# Patient Record
Sex: Female | Born: 1955 | Race: White | Hispanic: No | Smoking: Never smoker
Health system: Southern US, Community
[De-identification: ages and names within clinical notes are randomized; demographics above are authoritative.]

## PROBLEM LIST (undated history)

## (undated) DIAGNOSIS — E785 Hyperlipidemia, unspecified: Secondary | ICD-10-CM

## (undated) DIAGNOSIS — H21233 Degeneration of iris (pigmentary), bilateral: Secondary | ICD-10-CM

## (undated) DIAGNOSIS — H269 Unspecified cataract: Secondary | ICD-10-CM

## (undated) DIAGNOSIS — E079 Disorder of thyroid, unspecified: Secondary | ICD-10-CM

## (undated) DIAGNOSIS — H532 Diplopia: Principal | ICD-10-CM

## (undated) DIAGNOSIS — I10 Essential (primary) hypertension: Secondary | ICD-10-CM

## (undated) HISTORY — PX: TONSILLECTOMY: SUR1361

## (undated) HISTORY — DX: Hyperlipidemia, unspecified: E78.5

## (undated) HISTORY — DX: Essential (primary) hypertension: I10

## (undated) HISTORY — DX: Degeneration of iris (pigmentary), bilateral: H21.233

## (undated) HISTORY — DX: Disorder of thyroid, unspecified: E07.9

## (undated) HISTORY — PX: CATARACT EXTRACTION: SUR2

## (undated) HISTORY — DX: Diplopia: H53.2

## (undated) HISTORY — PX: WISDOM TOOTH EXTRACTION: SHX21

## (undated) HISTORY — DX: Unspecified cataract: H26.9

---

## 2003-05-18 ENCOUNTER — Other Ambulatory Visit: Admission: RE | Admit: 2003-05-18 | Discharge: 2003-05-18 | Payer: Self-pay | Admitting: Obstetrics and Gynecology

## 2004-03-16 ENCOUNTER — Ambulatory Visit: Payer: Self-pay | Admitting: Family Medicine

## 2004-07-30 ENCOUNTER — Ambulatory Visit: Payer: Self-pay | Admitting: Family Medicine

## 2004-12-13 ENCOUNTER — Encounter: Payer: Self-pay | Admitting: Family Medicine

## 2004-12-13 ENCOUNTER — Other Ambulatory Visit: Admission: RE | Admit: 2004-12-13 | Discharge: 2004-12-13 | Payer: Self-pay | Admitting: Family Medicine

## 2004-12-13 ENCOUNTER — Ambulatory Visit: Payer: Self-pay | Admitting: Family Medicine

## 2005-11-29 ENCOUNTER — Ambulatory Visit: Payer: Self-pay | Admitting: Family Medicine

## 2005-12-12 ENCOUNTER — Ambulatory Visit: Payer: Self-pay

## 2005-12-13 ENCOUNTER — Ambulatory Visit: Payer: Self-pay | Admitting: Family Medicine

## 2005-12-13 LAB — CONVERTED CEMR LAB
Alkaline Phosphatase: 41 units/L (ref 39–117)
BUN: 17 mg/dL (ref 6–23)
CO2: 30 meq/L (ref 19–32)
Calcium: 9.5 mg/dL (ref 8.4–10.5)
GFR calc non Af Amer: 81 mL/min
Potassium: 4.1 meq/L (ref 3.5–5.1)
Total Protein: 7.4 g/dL (ref 6.0–8.3)

## 2005-12-24 ENCOUNTER — Ambulatory Visit: Payer: Self-pay | Admitting: Family Medicine

## 2005-12-24 LAB — CONVERTED CEMR LAB
AST: 40 units/L — ABNORMAL HIGH (ref 0–37)
Albumin: 4.3 g/dL (ref 3.5–5.2)

## 2006-01-17 ENCOUNTER — Ambulatory Visit: Payer: Self-pay | Admitting: Family Medicine

## 2006-01-17 LAB — CONVERTED CEMR LAB
ALT: 28 U/L
AST: 24 U/L
Albumin: 3.6 g/dL
Alkaline Phosphatase: 44 U/L
Bilirubin, Direct: 0.1 mg/dL
Total Bilirubin: 0.6 mg/dL
Total Protein: 6.9 g/dL

## 2006-05-07 ENCOUNTER — Encounter: Payer: Self-pay | Admitting: Family Medicine

## 2006-05-07 ENCOUNTER — Ambulatory Visit: Payer: Self-pay | Admitting: Family Medicine

## 2006-05-07 ENCOUNTER — Other Ambulatory Visit: Admission: RE | Admit: 2006-05-07 | Discharge: 2006-05-07 | Payer: Self-pay | Admitting: Family Medicine

## 2006-05-07 DIAGNOSIS — E785 Hyperlipidemia, unspecified: Secondary | ICD-10-CM | POA: Insufficient documentation

## 2006-05-07 DIAGNOSIS — Z9189 Other specified personal risk factors, not elsewhere classified: Secondary | ICD-10-CM | POA: Insufficient documentation

## 2006-05-07 DIAGNOSIS — I10 Essential (primary) hypertension: Secondary | ICD-10-CM | POA: Insufficient documentation

## 2006-05-07 LAB — CONVERTED CEMR LAB
ALT: 20 units/L (ref 0–40)
Basophils Relative: 0.5 % (ref 0.0–1.0)
Bilirubin, Direct: 0.1 mg/dL (ref 0.0–0.3)
CO2: 29 meq/L (ref 19–32)
Calcium: 9.3 mg/dL (ref 8.4–10.5)
Creatinine, Ser: 0.8 mg/dL (ref 0.4–1.2)
Eosinophils Relative: 1.6 % (ref 0.0–5.0)
GFR calc Af Amer: 98 mL/min
Glucose, Bld: 93 mg/dL (ref 70–99)
Hemoglobin: 14.1 g/dL (ref 12.0–15.0)
Lymphocytes Relative: 31.3 % (ref 12.0–46.0)
Monocytes Absolute: 0.4 10*3/uL (ref 0.2–0.7)
Neutro Abs: 2.5 10*3/uL (ref 1.4–7.7)
Nitrite: NEGATIVE
Platelets: 187 10*3/uL (ref 150–400)
Protein, U semiquant: NEGATIVE
RDW: 11.8 % (ref 11.5–14.6)
Specific Gravity, Urine: 1.015
Total Bilirubin: 0.8 mg/dL (ref 0.3–1.2)
Total Protein: 7 g/dL (ref 6.0–8.3)
VLDL: 33 mg/dL (ref 0–40)
WBC: 4.4 10*3/uL — ABNORMAL LOW (ref 4.5–10.5)
pH: 6

## 2006-07-24 ENCOUNTER — Encounter: Payer: Self-pay | Admitting: Family Medicine

## 2007-07-03 ENCOUNTER — Telehealth (INDEPENDENT_AMBULATORY_CARE_PROVIDER_SITE_OTHER): Payer: Self-pay | Admitting: *Deleted

## 2007-07-17 ENCOUNTER — Encounter (INDEPENDENT_AMBULATORY_CARE_PROVIDER_SITE_OTHER): Payer: Self-pay | Admitting: *Deleted

## 2007-07-17 ENCOUNTER — Ambulatory Visit: Payer: Self-pay | Admitting: Family Medicine

## 2007-07-20 ENCOUNTER — Encounter (INDEPENDENT_AMBULATORY_CARE_PROVIDER_SITE_OTHER): Payer: Self-pay | Admitting: *Deleted

## 2007-08-03 ENCOUNTER — Ambulatory Visit: Payer: Self-pay | Admitting: Gastroenterology

## 2008-02-29 ENCOUNTER — Ambulatory Visit: Payer: Self-pay | Admitting: Family Medicine

## 2008-02-29 DIAGNOSIS — B029 Zoster without complications: Secondary | ICD-10-CM | POA: Insufficient documentation

## 2008-06-24 ENCOUNTER — Encounter: Admission: RE | Admit: 2008-06-24 | Discharge: 2008-06-24 | Payer: Self-pay | Admitting: Family Medicine

## 2008-07-19 ENCOUNTER — Ambulatory Visit: Payer: Self-pay | Admitting: Family Medicine

## 2008-07-19 ENCOUNTER — Other Ambulatory Visit: Admission: RE | Admit: 2008-07-19 | Discharge: 2008-07-19 | Payer: Self-pay | Admitting: Family Medicine

## 2008-07-19 ENCOUNTER — Encounter: Payer: Self-pay | Admitting: Family Medicine

## 2008-07-25 ENCOUNTER — Encounter (INDEPENDENT_AMBULATORY_CARE_PROVIDER_SITE_OTHER): Payer: Self-pay | Admitting: *Deleted

## 2008-07-27 ENCOUNTER — Telehealth (INDEPENDENT_AMBULATORY_CARE_PROVIDER_SITE_OTHER): Payer: Self-pay | Admitting: *Deleted

## 2008-08-01 ENCOUNTER — Encounter (INDEPENDENT_AMBULATORY_CARE_PROVIDER_SITE_OTHER): Payer: Self-pay | Admitting: *Deleted

## 2008-10-19 ENCOUNTER — Encounter: Payer: Self-pay | Admitting: Family Medicine

## 2009-07-12 ENCOUNTER — Encounter: Admission: RE | Admit: 2009-07-12 | Discharge: 2009-07-12 | Payer: Self-pay | Admitting: Family Medicine

## 2009-11-07 ENCOUNTER — Encounter: Payer: Self-pay | Admitting: Family Medicine

## 2009-11-07 ENCOUNTER — Other Ambulatory Visit: Admission: RE | Admit: 2009-11-07 | Discharge: 2009-11-07 | Payer: Self-pay | Admitting: Family Medicine

## 2009-11-07 ENCOUNTER — Ambulatory Visit: Payer: Self-pay | Admitting: Family Medicine

## 2009-11-08 ENCOUNTER — Encounter: Payer: Self-pay | Admitting: Gastroenterology

## 2009-11-09 ENCOUNTER — Ambulatory Visit: Payer: Self-pay | Admitting: Family Medicine

## 2009-11-09 LAB — CONVERTED CEMR LAB
Blood in Urine, dipstick: NEGATIVE
Glucose, Urine, Semiquant: NEGATIVE
Nitrite: NEGATIVE
Protein, U semiquant: NEGATIVE
Urobilinogen, UA: 0.2
WBC Urine, dipstick: NEGATIVE
pH: 5

## 2009-11-14 LAB — CONVERTED CEMR LAB
ALT: 17 units/L (ref 0–35)
Albumin: 4 g/dL (ref 3.5–5.2)
Alkaline Phosphatase: 54 units/L (ref 39–117)
Basophils Absolute: 0 10*3/uL (ref 0.0–0.1)
Bilirubin, Direct: 0.1 mg/dL (ref 0.0–0.3)
CO2: 26 meq/L (ref 19–32)
Chloride: 102 meq/L (ref 96–112)
Creatinine, Ser: 0.7 mg/dL (ref 0.4–1.2)
Eosinophils Absolute: 0.1 10*3/uL (ref 0.0–0.7)
Glucose, Bld: 81 mg/dL (ref 70–99)
HDL: 54.8 mg/dL (ref >39.00)
Lymphocytes Relative: 37.5 % (ref 12.0–46.0)
MCHC: 35.3 g/dL (ref 30.0–36.0)
Neutrophils Relative %: 50.7 % (ref 43.0–77.0)
RBC: 4.53 M/uL (ref 3.87–5.11)
RDW: 12.9 % (ref 11.5–14.6)
Total Protein: 6.7 g/dL (ref 6.0–8.3)
Triglycerides: 174 mg/dL — ABNORMAL HIGH (ref 0.0–149.0)

## 2009-12-04 ENCOUNTER — Encounter (INDEPENDENT_AMBULATORY_CARE_PROVIDER_SITE_OTHER): Payer: Self-pay | Admitting: *Deleted

## 2009-12-06 ENCOUNTER — Ambulatory Visit: Payer: Self-pay | Admitting: Gastroenterology

## 2009-12-20 ENCOUNTER — Ambulatory Visit: Payer: Self-pay | Admitting: Gastroenterology

## 2009-12-22 ENCOUNTER — Encounter: Payer: Self-pay | Admitting: Gastroenterology

## 2010-02-24 ENCOUNTER — Encounter: Payer: Self-pay | Admitting: Family Medicine

## 2010-03-04 LAB — CONVERTED CEMR LAB
ALT: 17 units/L (ref 0–35)
ALT: 21 units/L (ref 0–35)
AST: 24 units/L (ref 0–37)
AST: 25 units/L (ref 0–37)
Alkaline Phosphatase: 52 units/L (ref 39–117)
Basophils Relative: 0 % (ref 0.0–3.0)
Basophils Relative: 0.5 % (ref 0.0–1.0)
Bilirubin Urine: NEGATIVE
Bilirubin, Direct: 0 mg/dL (ref 0.0–0.3)
CO2: 30 meq/L (ref 19–32)
CRP, High Sensitivity: <1 — ABNORMAL LOW (ref 0.00–5.00)
Calcium: 9.8 mg/dL (ref 8.4–10.5)
Calcium: 9.9 mg/dL (ref 8.4–10.5)
Chloride: 104 meq/L (ref 96–112)
Cholesterol: 270 mg/dL — ABNORMAL HIGH (ref 0–200)
Creatinine, Ser: 0.7 mg/dL (ref 0.4–1.2)
Creatinine, Ser: 0.8 mg/dL (ref 0.4–1.2)
Direct LDL: 183.9 mg/dL
Eosinophils Relative: 2 % (ref 0.0–5.0)
Eosinophils Relative: 2.1 % (ref 0.0–5.0)
Glucose, Bld: 90 mg/dL (ref 70–99)
HDL: 51.9 mg/dL (ref >39.0)
Hemoglobin: 15.1 g/dL — ABNORMAL HIGH (ref 12.0–15.0)
Hemoglobin: 15.8 g/dL — ABNORMAL HIGH (ref 12.0–15.0)
Ketones, urine, test strip: NEGATIVE
Lymphocytes Relative: 30.7 % (ref 12.0–46.0)
Lymphocytes Relative: 34.9 % (ref 12.0–46.0)
MCHC: 34.4 g/dL (ref 30.0–36.0)
Monocytes Relative: 8.2 % (ref 3.0–12.0)
Monocytes Relative: 9.1 % (ref 3.0–12.0)
Neutro Abs: 2.3 10*3/uL (ref 1.4–7.7)
Neutro Abs: 2.5 10*3/uL (ref 1.4–7.7)
Neutrophils Relative %: 54.8 % (ref 43.0–77.0)
Neutrophils Relative %: 57.7 % (ref 43.0–77.0)
Nitrite: NEGATIVE
Pap Smear: NORMAL
Protein, U semiquant: NEGATIVE
RBC: 4.58 M/uL (ref 3.87–5.11)
RBC: 4.77 M/uL (ref 3.87–5.11)
Sodium: 141 meq/L (ref 135–145)
Total Bilirubin: 0.9 mg/dL (ref 0.3–1.2)
Total CHOL/HDL Ratio: 5
Total CHOL/HDL Ratio: 5
Total Protein: 7.5 g/dL (ref 6.0–8.3)
Total Protein: 7.5 g/dL (ref 6.0–8.3)
Triglycerides: 132 mg/dL (ref 0.0–149.0)
Urobilinogen, UA: NEGATIVE
VLDL: 22 mg/dL (ref 0–40)
VLDL: 26.4 mg/dL (ref 0.0–40.0)
WBC: 4.2 10*3/uL — ABNORMAL LOW (ref 4.5–10.5)
WBC: 4.3 10*3/uL — ABNORMAL LOW (ref 4.5–10.5)

## 2010-03-06 NOTE — Miscellaneous (Signed)
Summary: [facility]  Clinical Lists Changes  Medications: Added new medication of MOVIPREP 100 GM  SOLR (PEG-KCL-NACL-NASULF-NA ASC-C) As per prep instructions. - Signed Rx of MOVIPREP 100 GM  SOLR (PEG-KCL-NACL-NASULF-NA ASC-C) As per prep instructions.;  #1 x 0;  Signed;  Entered by: Ezra Sites RN;  Authorized by: Mardella Layman MD [facility];  Method used: Electronically to [facility] [phone]*, [address] [place], [place], [place]  [address], Ph: [phone] or [phone], Fax: [phone] Observations: Added new observation of NKA: T (12/06/2009 10:20)    Prescriptions: MOVIPREP 100 GM  SOLR (PEG-KCL-NACL-NASULF-NA ASC-C) As per prep instructions.  #1 x 0   Entered by:   Ezra Sites RN   Authorized by:   Mardella Layman MD [facility]   Signed by:   Ezra Sites RN on 12/06/2009   Method used:   Electronically to        [facility] [phone]* (retail)       [address] [place], [place]  [address]       Ph: [phone] or [phone]       Fax: [phone]   RxID:   [id number]

## 2010-03-06 NOTE — Procedures (Signed)
Summary: Colonoscopy  Patient: Sarah Zuniga Note: All result statuses are Final unless otherwise noted.  Tests: (1) Colonoscopy (COL)   COL Colonoscopy           DONE     [facility]     [address]     [place], [place]  [address]           COLONOSCOPY PROCEDURE REPORT           PATIENT:  [name], [name]  MR#:  [id number]     BIRTHDATE:  [birth year], 53 yrs. old  GENDER:  female     ENDOSCOPIST:  Vania Rea. Jarold Motto, MD, [facility]     REF. BY:  Loreen Freud, DO     PROCEDURE DATE:  12/20/2009     PROCEDURE:  Colonoscopy with snare polypectomy     ASA CLASS:  Class II     INDICATIONS:  Routine Risk Screening     MEDICATIONS:   Fentanyl 75 mcg IV, Versed 8 mg IV           DESCRIPTION OF PROCEDURE:   After the risks benefits and     alternatives of the procedure were thoroughly explained, informed     consent was obtained.  Digital rectal exam was performed and     revealed no abnormalities.   The LB CF-[id number] endoscope     was introduced through the anus and advanced to the cecum, which     was identified by both the appendix and ileocecal valve, without     limitations.  The quality of the prep was excellent, using     MoviPrep.  The instrument was then slowly withdrawn as the colon     was fully examined.     <<PROCEDUREIMAGES>>           FINDINGS:  A sessile polyp was found in the descending colon. 3-4     mm flat polyp cold snare excised and sent to patn.  This was     otherwise a normal examination of the colon.   Retroflexed views     in the rectum revealed no abnormalities.    The scope was then     withdrawn from the patient and the procedure completed.           COMPLICATIONS:  None     ENDOSCOPIC IMPRESSION:     1) Sessile polyp in the descending colon     2) Otherwise normal examination     R/O ADENOMA.     RECOMMENDATIONS:     1) Repeat colonoscopy in 5 years if polyp adenomatous; otherwise     10 years     REPEAT EXAM:  No        ______________________________     Vania Rea. Jarold Motto, MD, [facility]           CC:           n.     eSIGNED:   Vania Rea. Patterson at 12/20/2009 11:51 AM           Tomasa Rand, [id number]  Note: An exclamation mark (!) indicates a result that was not dispersed into the flowsheet. Document Creation Date: 12/20/2009 11:51 AM _______________________________________________________________________  (1) Order result status: Final Collection or observation date-time: 12/20/2009 11:45 Requested date-time:  Receipt date-time:  Reported date-time:  Referring Physician:   Ordering Physician: Sheryn Bison [phone]) Specimen Source:  Source: Launa Grill Order Number: [id number] Lab site:   Appended Document: Colonoscopy  5Y F/U  Appended Document: Colonoscopy     Procedures Next Due Date:    Colonoscopy: 12/2014

## 2010-03-06 NOTE — Assessment & Plan Note (Signed)
Summary: CPX/PAP//KN   Vital Signs:  Patient profile:   55 year old female Height:      66 inches Weight:      154.4 pounds BMI:     25.01 Pulse rate:   68 / minute Pulse rhythm:   regular BP sitting:   120 / 80  (left arm) Cuff size:   regular  Vitals Entered By: Almeta Monas CMA Duncan Dull) (November 07, 2009 1:19 PM) CC: cpx with pap. pt not fasting   History of Present Illness: Pt here for cpe and pap.  pt is not fasting.    Preventive Screening-Counseling & Management  Alcohol-Tobacco     Alcohol drinks/day: <1     Alcohol type: wine     Smoking Status: never     Passive Smoke Exposure: yes     Passive Smoke Counseling: to avoid passive smoke exposure  Caffeine-Diet-Exercise     Caffeine use/day: 2     Diet Comments: healthy diet--weight watchers     Does Patient Exercise: yes     Type of exercise: treadmill,  dvd     Exercise (avg: min/session): 30-60     Times/week: 5     Exercise Counseling: not indicated; exercise is adequate  Hep-HIV-STD-Contraception     HIV Risk: no     Dental Visit-last 6 months yes     Dental Care Counseling: not indicated; dental care within six months     SBE monthly: yes     Sun Exposure-Excessive: no     Sun Exposure Counseling: to decrease sun exposure  Safety-Violence-Falls     Seat Belt Use: yes     Seat Belt Counseling: not applicable     Firearms in the Home: no firearms in the home     Smoke Detectors: yes     Violence in the Home: no risk noted     Sexual Abuse: no      Sexual History:  not sexually active.        Drug Use:  never.        Blood Transfusions:  no.        Travel History:  [place] and [place].    Current Medications (verified): 1)  Lisinopril 10 Mg  Tabs (Lisinopril) .Marland Kitchen.. 1 By Mouth Once Daily 2)  Kls Aller-Tec 10 Mg  Tabs (Cetirizine Hcl) .... Daily in Season 3)  Fish Oil 1000 Mg Caps (Omega-3 Fatty Acids) .... Daily 4)  Multivitamins  Caps (Multiple Vitamin) .... 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 Family History of CAD Female 1st degree relative <60 Family History of CAD Female 1st degree relative <50 Family History Diabetes 1st degree relative Family History High cholesterol Family History  Hypertension Family History Lung cancer, father Family History throat cancer, father  Social History: Reviewed history from 05/07/2006 and no changes required. 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 Eyes:  Denies blurring, discharge, double vision, eye irritation, eye pain, halos, itching, light sensitivity, red eye, vision loss-1 eye, and vision loss-both eyes; optho q1y . ENT:  Denies decreased hearing, difficulty  swallowing, ear discharge, earache, hoarseness, nasal congestion, nosebleeds, postnasal drainage, ringing in ears, sinus pressure, and sore throat. CV:  Denies bluish discoloration of lips or nails, chest pain or discomfort, difficulty breathing at night, difficulty breathing while lying down, fainting, fatigue, leg cramps with exertion, lightheadness, near fainting, palpitations, shortness of breath with exertion, swelling of feet, swelling of hands, and weight gain. Resp:  Denies chest discomfort, chest pain with inspiration, cough, coughing up blood, excessive snoring, hypersomnolence, morning headaches, pleuritic, shortness of breath, sputum productive, and wheezing. GI:  Denies abdominal pain, bloody stools, change in bowel habits, constipation, dark tarry stools, diarrhea, excessive appetite, gas, hemorrhoids, indigestion, loss of appetite, and nausea. GU:  Denies abnormal vaginal bleeding, decreased libido, discharge, dysuria, genital sores, hematuria, incontinence, nocturia, urinary frequency, and urinary hesitancy. MS:  Denies joint pain, joint redness, joint swelling, loss of strength, low back pain, mid back pain, muscle aches, muscle , cramps, muscle weakness, stiffness, and thoracic pain. Derm:  Denies changes in color of skin, changes in nail beds, dryness, excessive perspiration, flushing, hair loss, insect bite(s), itching, lesion(s), poor wound healing, and rash. Neuro:  Denies brief paralysis, difficulty with concentration, disturbances in coordination, falling down, headaches, inability to speak, memory loss, numbness, poor balance, seizures, sensation of room spinning, tingling, tremors, visual disturbances, and weakness. Psych:  Denies alternate hallucination ( auditory/visual), anxiety, depression, easily angered, easily tearful, irritability, mental problems, panic attacks, sense of great danger, suicidal thoughts/plans, thoughts of violence, unusual visions or sounds, and thoughts  /plans of harming others. Endo:  Denies cold intolerance, excessive hunger, excessive thirst, excessive urination, heat intolerance, polyuria, and weight change. Heme:  Denies abnormal bruising, bleeding, enlarge lymph nodes, fevers, pallor, and skin discoloration. Allergy:  Denies hives or rash, itching eyes, persistent infections, seasonal allergies, and sneezing.   Impression & Recommendations:  Problem # 1:  PREVENTIVE HEALTH CARE (ICD-V70.0) check fasting labs ghm utd  Orders: Gastroenterology Referral (GI) EKG w/ Interpretation ([address])  Problem # 2:  HYPERTENSION (ICD-401.9)  Her updated medication list for this problem includes:    Lisinopril 10 Mg Tabs (Lisinopril) .Marland Kitchen... 1 by mouth once daily  BP today: 120/80 Prior BP: 110/82 (07/19/2008)  Labs Reviewed: K+: 4.4 (07/19/2008) Creat: : 0.8 (07/19/2008)   Chol: 270 (07/19/2008)   HDL: 56.30 (07/19/2008)   LDL: DEL (07/17/2007)   TG: 132.0 (07/19/2008)  Orders: EKG w/ Interpretation ([address])  Problem # 3:  HYPERLIPIDEMIA (ICD-272.4)  Labs Reviewed: SGOT: 24 (07/19/2008)   SGPT: 17 (07/19/2008)   HDL:56.30 (07/19/2008), 51.9 (07/17/2007)  LDL:DEL (07/17/2007), DEL (05/07/2006)  Chol:270 (07/19/2008), 262 (07/17/2007)  Trig:132.0 (07/19/2008), 112 (07/17/2007)  Orders: EKG w/ Interpretation ([address])  Complete Medication List: 1)  Lisinopril 10 Mg Tabs (Lisinopril) .Marland Kitchen.. 1 by mouth once daily 2)  Kls Aller-tec 10 Mg Tabs (Cetirizine hcl) .... Daily in season 3)  Fish Oil 1000 Mg Caps (Omega-3 fatty acids) .... Daily  4)  Multivitamins Caps (Multiple vitamin) .... Daily  Other Orders: Admin 1st Vaccine ([address]) Flu Vaccine 45yrs + ([address]) Flu Vaccine Consent Questions     Do you have a history of severe allergic reactions to this vaccine? no    Any prior history of allergic reactions to egg and/or gelatin? no    Do you have a sensitivity to the preservative Thimersol? no    Do you have a past history of Guillan-Barre  Syndrome? no    Do you currently have an acute febrile illness? no    Have you ever had a severe reaction to latex? no    Vaccine information given and explained to patient? yes    Are you currently pregnant? no    Lot Number:[id number]   Exp Date:08/04/2010   Site Given  Left Deltoid IM Admin 1st Vaccine ([address]11) Flu Vaccine 64yrs + ([address])  Patient Instructions: 1)  rto fasting labs ---V70.0 , 272.4  401.9  bmp, hep, cbcd, TSH, lipid UA Prescriptions: LISINOPRIL 10 MG  TABS (LISINOPRIL) 1 by mouth once daily  #90 x 3   Entered and Authorized by:   Loreen Freud DO   Signed by:   Loreen Freud DO on 11/07/2009   Method used:   Electronically to        [facility] [phone]* (retail)       [address] [place], [place]  [address]       Ph: [phone] or [phone]       Fax: [phone]   RxID:   [id number]  .lbflu

## 2010-03-06 NOTE — Letter (Signed)
Summary: Patient Notice- Polyp Results  [facility]  [address] [place], [place] [address]   Phone: [phone]  Fax: [phone]        December 22, 2009 MRN: [id number]    Sarah Zuniga [address] [place], [place]  [address]    Dear Ms. Schicker,  I am pleased to inform you that the colon polyp(s) removed during your recent colonoscopy was (were) found to be benign (no cancer detected) upon pathologic examination.  I recommend you have a repeat colonoscopy examination in 5_ years to look for recurrent polyps, as having colon polyps increases your risk for having recurrent polyps or even colon cancer in the future.  Should you develop new or worsening symptoms of abdominal pain, bowel habit changes or bleeding from the rectum or bowels, please schedule an evaluation with either your primary care physician or with me.  Additional information/recommendations:  _X_ No further action with gastroenterology is needed at this time. Please      follow-up with your primary care physician for your other healthcare      needs.  __ Please call [phone] to schedule a return visit to review your      situation.  __ Please keep your follow-up visit as already scheduled.  __ Continue treatment plan as outlined the day of your exam.  Please call us if you are having persistent problems or have questions about your condition that have not been fully answered at this time.  Sincerely,  Mardella Layman MD [facility]  This letter has been electronically signed by your physician.  Appended Document: Patient Notice- Polyp Results Letter mailed

## 2010-03-06 NOTE — Letter (Signed)
Summary: [facility] Instructions  [facility]  [address] [place], [place] [address]   Phone: [phone]  Fax: [phone]       Sarah Zuniga    1955/05/20    MRN: [id number]        Procedure Day Dorna Bloom:  Wednesday 12/20/2009     Arrival Time: 10:00 am      Procedure Time: 11:00 am     Location of Procedure:                    _x _  [facility] (4th Floor)                        PREPARATION FOR COLONOSCOPY WITH MOVIPREP   Starting 5 days prior to your procedure Friday 11/11 do not eat nuts, seeds, popcorn, corn, beans, peas,  salads, or any raw vegetables.  Do not take any fiber supplements (e.g. Metamucil, Citrucel, and Benefiber).  THE DAY BEFORE YOUR PROCEDURE         DATE: Tuesday 11/15  1.  Drink clear liquids the entire day-NO SOLID FOOD  2.  Do not drink anything colored red or purple.  Avoid juices with pulp.  No orange juice.  3.  Drink at least 64 oz. (8 glasses) of fluid/clear liquids during the day to prevent dehydration and help the prep work efficiently.  CLEAR LIQUIDS INCLUDE: Water Jello Ice Popsicles Tea (sugar ok, no milk/cream) Powdered fruit flavored drinks Coffee (sugar ok, no milk/cream) Gatorade Juice: apple, white grape, white cranberry  Lemonade Clear bullion, consomm, broth Carbonated beverages (any kind) Strained chicken noodle soup Hard Candy                             4.  In the morning, mix first dose of MoviPrep solution:    Empty 1 Pouch A and 1 Pouch B into the disposable container    Add lukewarm drinking water to the top line of the container. Mix to dissolve    Refrigerate (mixed solution should be used within 24 hrs)  5.  Begin drinking the prep at 5:00 p.m. The MoviPrep container is divided by 4 marks.   Every 15 minutes drink the solution down to the next mark (approximately 8 oz) until the full liter is complete.   6.  Follow completed prep with 16 oz of clear liquid of your choice  (Nothing red or purple).  Continue to drink clear liquids until bedtime.  7.  Before going to bed, mix second dose of MoviPrep solution:    Empty 1 Pouch A and 1 Pouch B into the disposable container    Add lukewarm drinking water to the top line of the container. Mix to dissolve    Refrigerate  THE DAY OF YOUR PROCEDURE      DATE: Wednesday 11/16  Beginning at 6:00 a.m. (5 hours before procedure):         1. Every 15 minutes, drink the solution down to the next mark (approx 8 oz) until the full liter is complete.  2. Follow completed prep with 16 oz. of clear liquid of your choice.    3. You may drink clear liquids until 9:00 am (2 HOURS BEFORE PROCEDURE).   MEDICATION INSTRUCTIONS  Unless otherwise instructed, you should take regular prescription medications with a small sip of water   as early as possible the morning of  your procedure.           OTHER INSTRUCTIONS  You will need a responsible adult at least 55 years of age to accompany you and drive you home.   This person must remain in the waiting room during your procedure.  Wear loose fitting clothing that is easily removed.  Leave jewelry and other valuables at home.  However, you may wish to bring a book to read or  an iPod/MP3 player to listen to music as you wait for your procedure to start.  Remove all body piercing jewelry and leave at home.  Total time from sign-in until discharge is approximately 2-3 hours.  You should go home directly after your procedure and rest.  You can resume normal activities the  day after your procedure.  The day of your procedure you should not:   Drive   Make legal decisions   Operate machinery   Drink alcohol   Return to work  You will receive specific instructions about eating, activities and medications before you leave.    The above instructions have been reviewed and explained to me by   Ezra Sites RN  December 06, 2009 10:41 AM     I fully  understand and can verbalize these instructions _____________________________ Date _________

## 2010-03-06 NOTE — Letter (Signed)
Summary: Pre Visit Letter Revised  [facility]  [address] [place], [place] [address]   Phone: [phone]  Fax: [phone]        11/08/2009 MRN: [id number]  [facility] [address] [place], [place]  [address]                              Procedure Date:  11-16 at 11am Welcome to the Gastroenterology Division at [facility].    You are scheduled to see a nurse for your pre-procedure visit on 12-06-09 at 10:30am on the 3rd floor at [facility], [address]  We ask that you try to arrive at our office 15 minutes prior to your appointment time to allow for check-in.  Please take a minute to review the attached form.  If you answer "Yes" to one or more of the questions on the first page, we ask that you call the person listed at your earliest opportunity.  If you answer "No" to all of the questions, please complete the rest of the form and bring it to your appointment.    Your nurse visit will consist of discussing your medical and surgical history, your immediate family medical history, and your medications.   If you are unable to list all of your medications on the form, please bring the medication bottles to your appointment and we will list them.  We will need to be aware of both prescribed and over the counter drugs.  We will need to know exact dosage information as well.    Please be prepared to read and sign documents such as consent forms, a financial agreement, and acknowledgement forms.  If necessary, and with your consent, a friend or relative is welcome to sit-in on the nurse visit with you.  Please bring your insurance card so that we may make a copy of it.  If your insurance requires a referral to see a specialist, please bring your referral form from your primary care physician.  No co-pay is required for this nurse visit.     If you cannot keep your appointment, please call [phone] to cancel or reschedule prior to your appointment  date.  This allows [place] the opportunity to schedule an appointment for another patient in need of care.    Thank you for choosing [facility] for your medical needs.  We appreciate the opportunity to care for you.  Please visit [place] at our website  to learn more about our practice.  Sincerely, The Gastroenterology Division

## 2010-07-06 ENCOUNTER — Other Ambulatory Visit: Payer: Self-pay | Admitting: Family Medicine

## 2010-07-06 DIAGNOSIS — Z1231 Encounter for screening mammogram for malignant neoplasm of breast: Secondary | ICD-10-CM

## 2010-07-17 ENCOUNTER — Ambulatory Visit
Admission: RE | Admit: 2010-07-17 | Discharge: 2010-07-17 | Disposition: A | Discharge status: Home / Self Care | Payer: 59 | Source: Ambulatory Visit | Attending: Family Medicine | Admitting: Family Medicine

## 2010-07-17 DIAGNOSIS — Z1231 Encounter for screening mammogram for malignant neoplasm of breast: Secondary | ICD-10-CM

## 2010-11-29 ENCOUNTER — Encounter: Payer: Self-pay | Admitting: Family Medicine

## 2010-11-29 ENCOUNTER — Other Ambulatory Visit (HOSPITAL_COMMUNITY)
Admission: RE | Admit: 2010-11-29 | Discharge: 2010-11-29 | Disposition: A | Discharge status: Home / Self Care | Payer: 59 | Source: Ambulatory Visit | Attending: Family Medicine | Admitting: Family Medicine

## 2010-11-29 ENCOUNTER — Ambulatory Visit (INDEPENDENT_AMBULATORY_CARE_PROVIDER_SITE_OTHER): Payer: 59 | Admitting: Family Medicine

## 2010-11-29 VITALS — BP 126/72 | HR 61 | Temp 98.7°F | Ht 66.75 in | Wt 162.0 lb

## 2010-11-29 DIAGNOSIS — Z23 Encounter for immunization: Secondary | ICD-10-CM

## 2010-11-29 DIAGNOSIS — Z0000 Encounter for general adult medical examination without abnormal findings: Secondary | ICD-10-CM

## 2010-11-29 DIAGNOSIS — I10 Essential (primary) hypertension: Secondary | ICD-10-CM

## 2010-11-29 DIAGNOSIS — Z01419 Encounter for gynecological examination (general) (routine) without abnormal findings: Secondary | ICD-10-CM | POA: Insufficient documentation

## 2010-11-29 LAB — CBC WITH DIFFERENTIAL/PLATELET
Basophils Absolute: 0 10*3/uL (ref 0.0–0.1)
Basophils Relative: 0.5 % (ref 0.0–3.0)
Eosinophils Absolute: 0.1 10*3/uL (ref 0.0–0.7)
Hemoglobin: 15.2 g/dL — ABNORMAL HIGH (ref 12.0–15.0)
MCHC: 34.7 g/dL (ref 30.0–36.0)
MCV: 92.9 fl (ref 78.0–100.0)
Monocytes Absolute: 0.3 10*3/uL (ref 0.1–1.0)
Neutro Abs: 2.7 10*3/uL (ref 1.4–7.7)
Neutrophils Relative %: 54.8 % (ref 43.0–77.0)
RBC: 4.71 Mil/uL (ref 3.87–5.11)
RDW: 12.4 % (ref 11.5–14.6)

## 2010-11-29 LAB — LIPID PANEL
HDL: 54.6 mg/dL (ref >39.00)
Triglycerides: 160 mg/dL — ABNORMAL HIGH (ref 0.0–149.0)
VLDL: 32 mg/dL (ref 0.0–40.0)

## 2010-11-29 LAB — BASIC METABOLIC PANEL
CO2: 25 mEq/L (ref 19–32)
Calcium: 9.3 mg/dL (ref 8.4–10.5)
Chloride: 105 mEq/L (ref 96–112)
Creatinine, Ser: 0.8 mg/dL (ref 0.4–1.2)
Glucose, Bld: 86 mg/dL (ref 70–99)

## 2010-11-29 LAB — HEPATIC FUNCTION PANEL
Albumin: 4.6 g/dL (ref 3.5–5.2)
Total Protein: 7.8 g/dL (ref 6.0–8.3)

## 2010-11-29 LAB — POCT URINALYSIS DIPSTICK
Bilirubin, UA: NEGATIVE
Blood, UA: NEGATIVE
Ketones, UA: NEGATIVE
Leukocytes, UA: NEGATIVE
Protein, UA: NEGATIVE
Spec Grav, UA: 1
pH, UA: 5

## 2010-11-29 MED ORDER — LISINOPRIL 10 MG PO TABS: 10.0000 mg | ORAL_TABLET | Freq: Every day | ORAL | Status: DC

## 2010-11-29 NOTE — Patient Instructions (Signed)

## 2010-11-29 NOTE — Progress Notes (Signed)
Subjective:     Sarah Zuniga is a 55 y.o. female and is here for a comprehensive physical exam. The patient reports problems - thinning eye brows.  History   Social History  . Marital Status: Divorced    Spouse Name: N/A    Number of Children: N/A  . Years of Education: N/A   Occupational History  . 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 [employer]     Social History Main Topics  . Smoking status: Never Smoker   . Smokeless tobacco: Not on file  . Alcohol Use: Yes  . Drug Use: No  . Sexually Active: Not Currently -- Female partner(s)   Other Topics Concern  . Not on file   Social History Narrative  . No narrative on file   Health Maintenance  Topic Date Due  . Pap Smear  01/24/1974  . Influenza Vaccine  11/05/2011  . Mammogram  07/16/2012  . Tetanus/tdap  12/14/2014  . Colonoscopy  12/21/2014    The following portions of the patient's history were reviewed and updated as appropriate: allergies, current medications, past family history, past medical history, past social history, past surgical history and problem list.  Review of Systems Review of Systems  Constitutional: Negative for activity change, appetite change and fatigue.  HENT: Negative for hearing loss, congestion, tinnitus and ear discharge.  dentist q50m Eyes: Negative for visual disturbance (see optho q1y -- vision corrected to 20/20 with glasses).  Respiratory: Negative for cough, chest tightness and shortness of breath.   Cardiovascular: Negative for chest pain, palpitations and leg swelling.  Gastrointestinal: Negative for abdominal pain, diarrhea, constipation and abdominal distention.  Genitourinary: Negative for urgency, frequency, decreased urine volume and difficulty urinating.  Musculoskeletal: Negative for back pain, arthralgias and gait problem.  Skin: Negative for color change, pallor and rash.  Neurological: Negative for dizziness, light-headedness, numbness and headaches.  Hematological: Negative for  adenopathy. Does not bruise/bleed easily.  Psychiatric/Behavioral: Negative for suicidal ideas, confusion, sleep disturbance, self-injury, dysphoric mood, decreased concentration and agitation.       Objective:    BP 126/72  Pulse 61  Temp(Src) 98.7 F (37.1 C) (Oral)  Ht 5' 6.75" (1.695 m)  Wt 162 lb (73.483 kg)  BMI 25.56 kg/m2  SpO2 95%  General Appearance:    Alert, cooperative, no distress, appears stated age  Head:    Normocephalic, without obvious abnormality, atraumatic  Eyes:    PERRL, conjunctiva/corneas clear, EOM's intact, fundi    benign, both eyes  Ears:    Normal TM's and external ear canals, both ears  Nose:   Nares normal, septum midline, mucosa normal, no drainage    or sinus tenderness  Throat:   Lips, mucosa, and tongue normal; teeth and gums normal  Neck:   Supple, symmetrical, trachea midline, no adenopathy;    thyroid:  no enlargement/tenderness/nodules; no carotid   bruit or JVD  Back:     Symmetric, no curvature, ROM normal, no CVA tenderness  Lungs:     Clear to auscultation bilaterally, respirations unlabored  Chest Wall:    No tenderness or deformity   Heart:    Regular rate and rhythm, S1 and S2 normal, no murmur, rub   or gallop  Breast Exam:    No tenderness, masses, or nipple abnormality  Abdomen:     Soft, non-tender, bowel sounds active all four quadrants,    no masses, no organomegaly  Genitalia:    Normal female without lesion, discharge or tenderness. Pap  done      Rectal:    Normal tone,, no masses or tenderness;   guaiac negative stool  Extremities:   Extremities normal, atraumatic, no cyanosis or edema  Pulses:   2+ and symmetric all extremities  Skin:   Skin color, texture, turgor normal, no rashes or lesions  Lymph nodes:   Cervical, supraclavicular, and axillary nodes normal  Neurologic:   CNII-XII intact, normal strength, sensation and reflexes    throughout      Assessment:    Healthy female exam.   HtN    hyperlipidemia   Plan:    ghm utd  con't meds Check fasting labs  See After Visit Summary for Counseling Recommendations

## 2011-08-19 ENCOUNTER — Other Ambulatory Visit: Payer: Self-pay | Admitting: Family Medicine

## 2011-08-19 DIAGNOSIS — Z1231 Encounter for screening mammogram for malignant neoplasm of breast: Secondary | ICD-10-CM

## 2011-09-04 ENCOUNTER — Ambulatory Visit
Admission: RE | Admit: 2011-09-04 | Discharge: 2011-09-04 | Disposition: A | Discharge status: Home / Self Care | Payer: 59 | Source: Ambulatory Visit | Attending: Family Medicine | Admitting: Family Medicine

## 2011-09-04 DIAGNOSIS — Z1231 Encounter for screening mammogram for malignant neoplasm of breast: Secondary | ICD-10-CM

## 2011-12-05 ENCOUNTER — Ambulatory Visit (INDEPENDENT_AMBULATORY_CARE_PROVIDER_SITE_OTHER): Payer: 59 | Admitting: Family Medicine

## 2011-12-05 ENCOUNTER — Encounter: Payer: Self-pay | Admitting: Family Medicine

## 2011-12-05 ENCOUNTER — Other Ambulatory Visit (HOSPITAL_COMMUNITY)
Admission: RE | Admit: 2011-12-05 | Discharge: 2011-12-05 | Disposition: A | Discharge status: Home / Self Care | Payer: 59 | Source: Ambulatory Visit | Attending: Family Medicine | Admitting: Family Medicine

## 2011-12-05 VITALS — BP 124/72 | HR 74 | Temp 98.5°F | Ht 65.75 in | Wt 165.2 lb

## 2011-12-05 DIAGNOSIS — Z0000 Encounter for general adult medical examination without abnormal findings: Secondary | ICD-10-CM

## 2011-12-05 DIAGNOSIS — Z124 Encounter for screening for malignant neoplasm of cervix: Secondary | ICD-10-CM

## 2011-12-05 DIAGNOSIS — I10 Essential (primary) hypertension: Secondary | ICD-10-CM

## 2011-12-05 DIAGNOSIS — Z23 Encounter for immunization: Secondary | ICD-10-CM

## 2011-12-05 DIAGNOSIS — E785 Hyperlipidemia, unspecified: Secondary | ICD-10-CM

## 2011-12-05 DIAGNOSIS — Z01419 Encounter for gynecological examination (general) (routine) without abnormal findings: Secondary | ICD-10-CM | POA: Insufficient documentation

## 2011-12-05 LAB — BASIC METABOLIC PANEL
BUN: 11 mg/dL (ref 6–23)
CO2: 27 mEq/L (ref 19–32)
Calcium: 9.7 mg/dL (ref 8.4–10.5)
Chloride: 104 mEq/L (ref 96–112)
Creatinine, Ser: 0.8 mg/dL (ref 0.4–1.2)
Glucose, Bld: 87 mg/dL (ref 70–99)

## 2011-12-05 LAB — LIPID PANEL
HDL: 48.5 mg/dL (ref >39.00)
Triglycerides: 223 mg/dL — ABNORMAL HIGH (ref 0.0–149.0)
VLDL: 44.6 mg/dL — ABNORMAL HIGH (ref 0.0–40.0)

## 2011-12-05 LAB — POCT URINALYSIS DIPSTICK
Ketones, UA: NEGATIVE
Leukocytes, UA: NEGATIVE
Protein, UA: NEGATIVE
pH, UA: 6

## 2011-12-05 LAB — LDL CHOLESTEROL, DIRECT: Direct LDL: 194.5 mg/dL

## 2011-12-05 LAB — CBC WITH DIFFERENTIAL/PLATELET
Basophils Absolute: 0 10*3/uL (ref 0.0–0.1)
Basophils Relative: 0.4 % (ref 0.0–3.0)
Eosinophils Absolute: 0.1 10*3/uL (ref 0.0–0.7)
MCHC: 33.5 g/dL (ref 30.0–36.0)
MCV: 93 fl (ref 78.0–100.0)
Monocytes Absolute: 0.4 10*3/uL (ref 0.1–1.0)
Neutrophils Relative %: 62 % (ref 43.0–77.0)
Platelets: 215 10*3/uL (ref 150.0–400.0)
RDW: 12.9 % (ref 11.5–14.6)

## 2011-12-05 LAB — HEPATIC FUNCTION PANEL
Bilirubin, Direct: 0 mg/dL (ref 0.0–0.3)
Total Bilirubin: 0.5 mg/dL (ref 0.3–1.2)
Total Protein: 7.9 g/dL (ref 6.0–8.3)

## 2011-12-05 MED ORDER — LISINOPRIL 10 MG PO TABS: 10.0000 mg | ORAL_TABLET | Freq: Every day | ORAL | Status: DC

## 2011-12-05 NOTE — Assessment & Plan Note (Signed)
Stable--per nephrology

## 2011-12-05 NOTE — Assessment & Plan Note (Signed)
Check labs 

## 2011-12-05 NOTE — Progress Notes (Signed)
Subjective:     Sarah Zuniga is a 56 y.o. female and is here for a comprehensive physical exam. The patient reports no problems.  History   Social History  . Marital Status: Divorced    Spouse Name: N/A    Number of Children: N/A  . Years of Education: N/A   Occupational History  . 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 [employer]     Social History Main Topics  . Smoking status: Never Smoker   . Smokeless tobacco: Not on file  . Alcohol Use: Yes  . Drug Use: No  . Sexually Active: Not Currently -- Female partner(s)   Other Topics Concern  . Not on file   Social History Narrative   Exercise-- 4x a week   Health Maintenance  Topic Date Due  . Influenza Vaccine  10/05/2012  . Mammogram  09/03/2013  . Pap Smear  12/05/2014  . Tetanus/tdap  12/14/2014  . Colonoscopy  12/21/2014    The following portions of the patient's history were reviewed and updated as appropriate:  She  has a past medical history of Hyperlipidemia and Hypertension. She  does not have any pertinent problems on file. She  has past surgical history that includes Tonsillectomy and Wisdom tooth extraction. Her family history includes Cancer in her father; Coronary artery disease in her other; Diabetes in her mother; Hyperlipidemia in her father and mother; and Hypertension in her father and mother. She  reports that she has never smoked. She does not have any smokeless tobacco history on file. She reports that she drinks alcohol. She reports that she does not use illicit drugs. She has a current medication list which includes the following prescription(s): cetirizine, lisinopril, multivitamin, and fish oil. Current Outpatient Prescriptions on File Prior to Visit  Medication Sig Dispense Refill  . cetirizine (KLS ALLER-TEC) 10 MG tablet Take 10 mg by mouth daily.        . Multiple Vitamin (MULTIVITAMIN) tablet Take 1 tablet by mouth daily.        . Omega-3 Fatty Acids (FISH OIL) 1000 MG CAPS Take 1,000 mg by mouth daily.         Marland Kitchen DISCONTD: lisinopril (PRINIVIL,ZESTRIL) 10 MG tablet Take 1 tablet (10 mg total) by mouth daily.  90 tablet  3   She  has no known allergies..  Review of Systems Review of Systems  Constitutional: Negative for activity change, appetite change and fatigue.  HENT: Negative for hearing loss, congestion, tinnitus and ear discharge.  dentist q22m Eyes: Negative for visual disturbance (see optho q1y -- vision corrected to 20/20 with glasses).  Respiratory: Negative for cough, chest tightness and shortness of breath.   Cardiovascular: Negative for chest pain, palpitations and leg swelling.  Gastrointestinal: Negative for abdominal pain, diarrhea, constipation and abdominal distention.  Genitourinary: Negative for urgency, frequency, decreased urine volume and difficulty urinating.  Musculoskeletal: Negative for back pain, arthralgias and gait problem.  Skin: Negative for color change, pallor and rash.  Neurological: Negative for dizziness, light-headedness, numbness and headaches.  Hematological: Negative for adenopathy. Does not bruise/bleed easily.  Psychiatric/Behavioral: Negative for suicidal ideas, confusion, sleep disturbance, self-injury, dysphoric mood, decreased concentration and agitation.       Objective:    BP 124/72  Pulse 74  Temp 98.5 F (36.9 C) (Oral)  Ht 5' 5.75" (1.67 m)  Wt 165 lb 3.2 oz (74.934 kg)  BMI 26.87 kg/m2  SpO2 99% General appearance: alert, cooperative, appears stated age and no distress Head:  Normocephalic, without obvious abnormality, atraumatic Eyes: conjunctivae/corneas clear. PERRL, EOM's intact. Fundi benign. Ears: normal TM's and external ear canals both ears Nose: Nares normal. Septum midline. Mucosa normal. No drainage or sinus tenderness. Throat: lips, mucosa, and tongue normal; teeth and gums normal Neck: no adenopathy, no carotid bruit, no JVD, supple, symmetrical, trachea midline and thyroid not enlarged, symmetric, no  tenderness/mass/nodules Back: symmetric, no curvature. ROM normal. No CVA tenderness. Lungs: clear to auscultation bilaterally Breasts: normal appearance, no masses or tenderness Heart: regular rate and rhythm, S1, S2 normal, no murmur, click, rub or gallop and normal apical impulse Abdomen: soft, non-tender; bowel sounds normal; no masses,  no organomegaly Pelvic: cervix normal in appearance, external genitalia normal, no adnexal masses or tenderness, no cervical motion tenderness, rectovaginal septum normal, uterus normal size, shape, and consistency and vagina normal without discharge Pap done Extremities: extremities normal, atraumatic, no cyanosis or edema Pulses: 2+ and symmetric Skin: Skin color, texture, turgor normal. No rashes or lesions Lymph nodes: Cervical, supraclavicular, and axillary nodes normal. Neurologic: Alert and oriented X 3, normal strength and tone. Normal symmetric reflexes. Normal coordination and gait psych-- no depression, no anxiety    Assessment:    Healthy female exam.      Plan:    ghm utd Check labs See After Visit Summary for Counseling Recommendations

## 2011-12-05 NOTE — Addendum Note (Signed)
Addended by: Arnette Norris on: 12/05/2011 05:22 PM   Modules accepted: Orders

## 2011-12-05 NOTE — Patient Instructions (Addendum)
Preventive Care for Adults, Female A healthy lifestyle and preventive care can promote health and wellness. Preventive health guidelines for women include the following key practices.  A routine yearly physical is a good way to check with your caregiver about your health and preventive screening. It is a chance to share any concerns and updates on your health, and to receive a thorough exam.  Visit your dentist for a routine exam and preventive care every 6 months. Brush your teeth twice a day and floss once a day. Good oral hygiene prevents tooth decay and gum disease.  The frequency of eye exams is based on your age, health, family medical history, use of contact lenses, and other factors. Follow your caregiver's recommendations for frequency of eye exams.  Eat a healthy diet. Foods like vegetables, fruits, whole grains, low-fat dairy products, and lean protein foods contain the nutrients you need without too many calories. Decrease your intake of foods high in solid fats, added sugars, and salt. Eat the right amount of calories for you.Get information about a proper diet from your caregiver, if necessary.  Regular physical exercise is one of the most important things you can do for your health. Most adults should get at least 150 minutes of moderate-intensity exercise (any activity that increases your heart rate and causes you to sweat) each week. In addition, most adults need muscle-strengthening exercises on 2 or more days a week.  Maintain a healthy weight. The body mass index (BMI) is a screening tool to identify possible weight problems. It provides an estimate of body fat based on height and weight. Your caregiver can help determine your BMI, and can help you achieve or maintain a healthy weight.For adults 20 years and older:  A BMI below 18.5 is considered underweight.  A BMI of 18.5 to 24.9 is normal.  A BMI of 25 to 29.9 is considered overweight.  A BMI of 30 and above is  considered obese.  Maintain normal blood lipids and cholesterol levels by exercising and minimizing your intake of saturated fat. Eat a balanced diet with plenty of fruit and vegetables. Blood tests for lipids and cholesterol should begin at age 20 and be repeated every 5 years. If your lipid or cholesterol levels are high, you are over 50, or you are at high risk for heart disease, you may need your cholesterol levels checked more frequently.Ongoing high lipid and cholesterol levels should be treated with medicines if diet and exercise are not effective.  If you smoke, find out from your caregiver how to quit. If you do not use tobacco, do not start.  If you are pregnant, do not drink alcohol. If you are breastfeeding, be very cautious about drinking alcohol. If you are not pregnant and choose to drink alcohol, do not exceed 1 drink per day. One drink is considered to be 12 ounces (355 mL) of beer, 5 ounces (148 mL) of wine, or 1.5 ounces (44 mL) of liquor.  Avoid use of street drugs. Do not share needles with anyone. Ask for help if you need support or instructions about stopping the use of drugs.  High blood pressure causes heart disease and increases the risk of stroke. Your blood pressure should be checked at least every 1 to 2 years. Ongoing high blood pressure should be treated with medicines if weight loss and exercise are not effective.  If you are 55 to 56 years old, ask your caregiver if you should take aspirin to prevent strokes.  Diabetes   screening involves taking a blood sample to check your fasting blood sugar level. This should be done once every 3 years, after age 45, if you are within normal weight and without risk factors for diabetes. Testing should be considered at a younger age or be carried out more frequently if you are overweight and have at least 1 risk factor for diabetes.  Breast cancer screening is essential preventive care for women. You should practice "breast  self-awareness." This means understanding the normal appearance and feel of your breasts and may include breast self-examination. Any changes detected, no matter how small, should be reported to a caregiver. Women in their 20s and 30s should have a clinical breast exam (CBE) by a caregiver as part of a regular health exam every 1 to 3 years. After age 40, women should have a CBE every year. Starting at age 40, women should consider having a mammography (breast X-ray test) every year. Women who have a family history of breast cancer should talk to their caregiver about genetic screening. Women at a high risk of breast cancer should talk to their caregivers about having magnetic resonance imaging (MRI) and a mammography every year.  The Pap test is a screening test for cervical cancer. A Pap test can show cell changes on the cervix that might become cervical cancer if left untreated. A Pap test is a procedure in which cells are obtained and examined from the lower end of the uterus (cervix).  Women should have a Pap test starting at age 21.  Between ages 21 and 29, Pap tests should be repeated every 2 years.  Beginning at age 30, you should have a Pap test every 3 years as long as the past 3 Pap tests have been normal.  Some women have medical problems that increase the chance of getting cervical cancer. Talk to your caregiver about these problems. It is especially important to talk to your caregiver if a new problem develops soon after your last Pap test. In these cases, your caregiver may recommend more frequent screening and Pap tests.  The above recommendations are the same for women who have or have not gotten the vaccine for human papillomavirus (HPV).  If you had a hysterectomy for a problem that was not cancer or a condition that could lead to cancer, then you no longer need Pap tests. Even if you no longer need a Pap test, a regular exam is a good idea to make sure no other problems are  starting.  If you are between ages 65 and 70, and you have had normal Pap tests going back 10 years, you no longer need Pap tests. Even if you no longer need a Pap test, a regular exam is a good idea to make sure no other problems are starting.  If you have had past treatment for cervical cancer or a condition that could lead to cancer, you need Pap tests and screening for cancer for at least 20 years after your treatment.  If Pap tests have been discontinued, risk factors (such as a new sexual partner) need to be reassessed to determine if screening should be resumed.  The HPV test is an additional test that may be used for cervical cancer screening. The HPV test looks for the virus that can cause the cell changes on the cervix. The cells collected during the Pap test can be tested for HPV. The HPV test could be used to screen women aged 30 years and older, and should   be used in women of any age who have unclear Pap test results. After the age of 30, women should have HPV testing at the same frequency as a Pap test.  Colorectal cancer can be detected and often prevented. Most routine colorectal cancer screening begins at the age of 50 and continues through age 75. However, your caregiver may recommend screening at an earlier age if you have risk factors for colon cancer. On a yearly basis, your caregiver may provide home test kits to check for hidden blood in the stool. Use of a small camera at the end of a tube, to directly examine the colon (sigmoidoscopy or colonoscopy), can detect the earliest forms of colorectal cancer. Talk to your caregiver about this at age 50, when routine screening begins. Direct examination of the colon should be repeated every 5 to 10 years through age 75, unless early forms of pre-cancerous polyps or small growths are found.  Hepatitis C blood testing is recommended for all people born from 1945 through 1965 and any individual with known risks for hepatitis C.  Practice  safe sex. Use condoms and avoid high-risk sexual practices to reduce the spread of sexually transmitted infections (STIs). STIs include gonorrhea, chlamydia, syphilis, trichomonas, herpes, HPV, and human immunodeficiency virus (HIV). Herpes, HIV, and HPV are viral illnesses that have no cure. They can result in disability, cancer, and death. Sexually active women aged 25 and younger should be checked for chlamydia. Older women with new or multiple partners should also be tested for chlamydia. Testing for other STIs is recommended if you are sexually active and at increased risk.  Osteoporosis is a disease in which the bones lose minerals and strength with aging. This can result in serious bone fractures. The risk of osteoporosis can be identified using a bone density scan. Women ages 65 and over and women at risk for fractures or osteoporosis should discuss screening with their caregivers. Ask your caregiver whether you should take a calcium supplement or vitamin D to reduce the rate of osteoporosis.  Menopause can be associated with physical symptoms and risks. Hormone replacement therapy is available to decrease symptoms and risks. You should talk to your caregiver about whether hormone replacement therapy is right for you.  Use sunscreen with sun protection factor (SPF) of 30 or more. Apply sunscreen liberally and repeatedly throughout the day. You should seek shade when your shadow is shorter than you. Protect yourself by wearing long sleeves, pants, a wide-brimmed hat, and sunglasses year round, whenever you are outdoors.  Once a month, do a whole body skin exam, using a mirror to look at the skin on your back. Notify your caregiver of new moles, moles that have irregular borders, moles that are larger than a pencil eraser, or moles that have changed in shape or color.  Stay current with required immunizations.  Influenza. You need a dose every fall (or winter). The composition of the flu vaccine  changes each year, so being vaccinated once is not enough.  Pneumococcal polysaccharide. You need 1 to 2 doses if you smoke cigarettes or if you have certain chronic medical conditions. You need 1 dose at age 65 (or older) if you have never been vaccinated.  Tetanus, diphtheria, pertussis (Tdap, Td). Get 1 dose of Tdap vaccine if you are younger than age 65, are over 65 and have contact with an infant, are a healthcare worker, are pregnant, or simply want to be protected from whooping cough. After that, you need a Td   booster dose every 10 years. Consult your caregiver if you have not had at least 3 tetanus and diphtheria-containing shots sometime in your life or have a deep or dirty wound.  HPV. You need this vaccine if you are a woman age 26 or younger. The vaccine is given in 3 doses over 6 months.  Measles, mumps, rubella (MMR). You need at least 1 dose of MMR if you were born in 1957 or later. You may also need a second dose.  Meningococcal. If you are age 19 to 21 and a first-year college student living in a residence hall, or have one of several medical conditions, you need to get vaccinated against meningococcal disease. You may also need additional booster doses.  Zoster (shingles). If you are age 60 or older, you should get this vaccine.  Varicella (chickenpox). If you have never had chickenpox or you were vaccinated but received only 1 dose, talk to your caregiver to find out if you need this vaccine.  Hepatitis A. You need this vaccine if you have a specific risk factor for hepatitis A virus infection or you simply wish to be protected from this disease. The vaccine is usually given as 2 doses, 6 to 18 months apart.  Hepatitis B. You need this vaccine if you have a specific risk factor for hepatitis B virus infection or you simply wish to be protected from this disease. The vaccine is given in 3 doses, usually over 6 months. Preventive Services / Frequency Ages 19 to 39  Blood  pressure check.** / Every 1 to 2 years.  Lipid and cholesterol check.** / Every 5 years beginning at age 20.  Clinical breast exam.** / Every 3 years for women in their 20s and 30s.  Pap test.** / Every 2 years from ages 21 through 29. Every 3 years starting at age 30 through age 65 or 70 with a history of 3 consecutive normal Pap tests.  HPV screening.** / Every 3 years from ages 30 through ages 65 to 70 with a history of 3 consecutive normal Pap tests.  Hepatitis C blood test.** / For any individual with known risks for hepatitis C.  Skin self-exam. / Monthly.  Influenza immunization.** / Every year.  Pneumococcal polysaccharide immunization.** / 1 to 2 doses if you smoke cigarettes or if you have certain chronic medical conditions.  Tetanus, diphtheria, pertussis (Tdap, Td) immunization. / A one-time dose of Tdap vaccine. After that, you need a Td booster dose every 10 years.  HPV immunization. / 3 doses over 6 months, if you are 26 and younger.  Measles, mumps, rubella (MMR) immunization. / You need at least 1 dose of MMR if you were born in 1957 or later. You may also need a second dose.  Meningococcal immunization. / 1 dose if you are age 19 to 21 and a first-year college student living in a residence hall, or have one of several medical conditions, you need to get vaccinated against meningococcal disease. You may also need additional booster doses.  Varicella immunization.** / Consult your caregiver.  Hepatitis A immunization.** / Consult your caregiver. 2 doses, 6 to 18 months apart.  Hepatitis B immunization.** / Consult your caregiver. 3 doses usually over 6 months. Ages 40 to 64  Blood pressure check.** / Every 1 to 2 years.  Lipid and cholesterol check.** / Every 5 years beginning at age 20.  Clinical breast exam.** / Every year after age 40.  Mammogram.** / Every year beginning at age 40   and continuing for as long as you are in good health. Consult with your  caregiver.  Pap test.** / Every 3 years starting at age 30 through age 65 or 70 with a history of 3 consecutive normal Pap tests.  HPV screening.** / Every 3 years from ages 30 through ages 65 to 70 with a history of 3 consecutive normal Pap tests.  Fecal occult blood test (FOBT) of stool. / Every year beginning at age 50 and continuing until age 75. You may not need to do this test if you get a colonoscopy every 10 years.  Flexible sigmoidoscopy or colonoscopy.** / Every 5 years for a flexible sigmoidoscopy or every 10 years for a colonoscopy beginning at age 50 and continuing until age 75.  Hepatitis C blood test.** / For all people born from 1945 through 1965 and any individual with known risks for hepatitis C.  Skin self-exam. / Monthly.  Influenza immunization.** / Every year.  Pneumococcal polysaccharide immunization.** / 1 to 2 doses if you smoke cigarettes or if you have certain chronic medical conditions.  Tetanus, diphtheria, pertussis (Tdap, Td) immunization.** / A one-time dose of Tdap vaccine. After that, you need a Td booster dose every 10 years.  Measles, mumps, rubella (MMR) immunization. / You need at least 1 dose of MMR if you were born in 1957 or later. You may also need a second dose.  Varicella immunization.** / Consult your caregiver.  Meningococcal immunization.** / Consult your caregiver.  Hepatitis A immunization.** / Consult your caregiver. 2 doses, 6 to 18 months apart.  Hepatitis B immunization.** / Consult your caregiver. 3 doses, usually over 6 months. Ages 65 and over  Blood pressure check.** / Every 1 to 2 years.  Lipid and cholesterol check.** / Every 5 years beginning at age 20.  Clinical breast exam.** / Every year after age 40.  Mammogram.** / Every year beginning at age 40 and continuing for as long as you are in good health. Consult with your caregiver.  Pap test.** / Every 3 years starting at age 30 through age 65 or 70 with a 3  consecutive normal Pap tests. Testing can be stopped between 65 and 70 with 3 consecutive normal Pap tests and no abnormal Pap or HPV tests in the past 10 years.  HPV screening.** / Every 3 years from ages 30 through ages 65 or 70 with a history of 3 consecutive normal Pap tests. Testing can be stopped between 65 and 70 with 3 consecutive normal Pap tests and no abnormal Pap or HPV tests in the past 10 years.  Fecal occult blood test (FOBT) of stool. / Every year beginning at age 50 and continuing until age 75. You may not need to do this test if you get a colonoscopy every 10 years.  Flexible sigmoidoscopy or colonoscopy.** / Every 5 years for a flexible sigmoidoscopy or every 10 years for a colonoscopy beginning at age 50 and continuing until age 75.  Hepatitis C blood test.** / For all people born from 1945 through 1965 and any individual with known risks for hepatitis C.  Osteoporosis screening.** / A one-time screening for women ages 65 and over and women at risk for fractures or osteoporosis.  Skin self-exam. / Monthly.  Influenza immunization.** / Every year.  Pneumococcal polysaccharide immunization.** / 1 dose at age 65 (or older) if you have never been vaccinated.  Tetanus, diphtheria, pertussis (Tdap, Td) immunization. / A one-time dose of Tdap vaccine if you are over   65 and have contact with an infant, are a healthcare worker, or simply want to be protected from whooping cough. After that, you need a Td booster dose every 10 years.  Varicella immunization.** / Consult your caregiver.  Meningococcal immunization.** / Consult your caregiver.  Hepatitis A immunization.** / Consult your caregiver. 2 doses, 6 to 18 months apart.  Hepatitis B immunization.** / Check with your caregiver. 3 doses, usually over 6 months. ** Family history and personal history of risk and conditions may change your caregiver's recommendations. Document Released: 03/19/2001 Document Revised: 04/15/2011  Document Reviewed: 06/18/2010 [employer] Patient Information 2013 [employer].  

## 2012-08-27 ENCOUNTER — Other Ambulatory Visit: Payer: Self-pay

## 2012-08-27 DIAGNOSIS — Z1231 Encounter for screening mammogram for malignant neoplasm of breast: Secondary | ICD-10-CM

## 2012-09-16 ENCOUNTER — Ambulatory Visit
Admission: RE | Admit: 2012-09-16 | Discharge: 2012-09-16 | Disposition: A | Discharge status: Home / Self Care | Payer: 59 | Source: Ambulatory Visit

## 2012-09-16 DIAGNOSIS — Z1231 Encounter for screening mammogram for malignant neoplasm of breast: Secondary | ICD-10-CM

## 2012-10-14 ENCOUNTER — Ambulatory Visit (INDEPENDENT_AMBULATORY_CARE_PROVIDER_SITE_OTHER): Payer: 59 | Admitting: Internal Medicine

## 2012-10-14 ENCOUNTER — Encounter: Payer: Self-pay | Admitting: Internal Medicine

## 2012-10-14 VITALS — BP 147/87 | HR 65 | Temp 98.5°F | Wt 165.8 lb

## 2012-10-14 DIAGNOSIS — J209 Acute bronchitis, unspecified: Secondary | ICD-10-CM

## 2012-10-14 MED ORDER — AZITHROMYCIN 250 MG PO TABS: ORAL_TABLET | ORAL | Status: DC

## 2012-10-14 MED ORDER — AZELASTINE HCL 0.15 % NA SOLN: NASAL | Status: DC

## 2012-10-14 NOTE — Patient Instructions (Signed)
Rest, fluids , tylenol For cough, take Mucinex DM twice a day as needed  For congestion use astelin nasal spray at night  a day until you feel better Take the antibiotic as prescribed  (zithromax) Call if no better in few days Call anytime if the symptoms are severe --- Check the  blood pressure 2 or 3 times a week, be sure it is between 110/60 and 140/80. If it is consistently higher or lower, let me know

## 2012-10-14 NOTE — Progress Notes (Signed)
  Subjective:    Patient ID: Sarah Zuniga, female    DOB: 01/01/1956, 57 y.o.   MRN: [id number]  HPI Acute visit Symptoms started approximately 3 weeks ago with sore throat, on and off congestion in the chest, congestion in the sinuses. She took temporarily Mucinex and Sudafed with some relief but has not been taking those for about a week, currently on DayQuil and NyQuil.  Past Medical History  Diagnosis Date  . Hyperlipidemia   . Hypertension    Past Surgical History  Procedure Laterality Date  . Tonsillectomy    . Wisdom tooth extraction       Review of Systems No fever or chills No sputum production No arthralgias or myalgias. No nausea, vomiting, diarrhea.     Objective:   Physical Exam BP 147/87  Pulse 65  Temp(Src) 98.5 F (36.9 C)  Wt 165 lb 12.8 oz (75.206 kg)  BMI 26.97 kg/m2  SpO2 100% General -- alert, well-developed, NAD.  HEENT-- Not pale. TMs bulge B, no red or d/c; throat symmetric, no redness or discharge. Face symmetric, sinuses not tender to palpation. Nose slt congested.  Lungs -- normal respiratory effort, no intercostal retractions, no accessory muscle use, and normal breath sounds.  Heart-- normal rate, regular rhythm, no murmur.  Psych-- Cognition and judgment appear intact. Alert and cooperative with normal attention span and concentration. not anxious appearing and not depressed appearing.     Assessment & Plan:  Bronchitis, mild. Upper respiratory symptoms for 2 weeks, atypical infection?. See instructions

## 2012-10-15 ENCOUNTER — Encounter: Payer: Self-pay | Admitting: Internal Medicine

## 2012-10-21 MED ORDER — AZITHROMYCIN 250 MG PO TABS: ORAL_TABLET | ORAL | Status: DC

## 2012-10-21 NOTE — Addendum Note (Signed)
Addended by: Arnette Norris on: 10/21/2012 08:14 AM   Modules accepted: Orders

## 2012-11-05 DIAGNOSIS — Z0000 Encounter for general adult medical examination without abnormal findings: Secondary | ICD-10-CM | POA: Insufficient documentation

## 2012-11-05 DIAGNOSIS — Z719 Counseling, unspecified: Secondary | ICD-10-CM | POA: Insufficient documentation

## 2012-12-03 ENCOUNTER — Telehealth: Payer: Self-pay

## 2012-12-03 NOTE — Telephone Encounter (Addendum)
Left message for call back Identifiable  Medication and allergies: reviewed and updated  90 day supply/mail order: na Local pharmacy: [facility]   Immunizations due:  Admin flu vaccine  A/P:   No changes to FH or PSH Checking to see if her insurance will cover pneumonia vaccine early MMG---Neg---08/2012 Pap--11/2011--neg Tdap---12/2004 [facility]--12/2009---Benign polyps--Dr Patterson--next due 2016  To Discuss with Provider: Not at this time

## 2012-12-07 ENCOUNTER — Encounter: Payer: Self-pay | Admitting: Family Medicine

## 2012-12-07 ENCOUNTER — Ambulatory Visit (INDEPENDENT_AMBULATORY_CARE_PROVIDER_SITE_OTHER): Payer: 59 | Admitting: Family Medicine

## 2012-12-07 VITALS — BP 132/78 | HR 68 | Temp 98.4°F | Ht 66.0 in | Wt 164.2 lb

## 2012-12-07 DIAGNOSIS — Z0000 Encounter for general adult medical examination without abnormal findings: Secondary | ICD-10-CM

## 2012-12-07 DIAGNOSIS — Z23 Encounter for immunization: Secondary | ICD-10-CM

## 2012-12-07 DIAGNOSIS — I10 Essential (primary) hypertension: Secondary | ICD-10-CM

## 2012-12-07 DIAGNOSIS — E785 Hyperlipidemia, unspecified: Secondary | ICD-10-CM

## 2012-12-07 LAB — LIPID PANEL
HDL: 54 mg/dL (ref >39.00)
Triglycerides: 193 mg/dL — ABNORMAL HIGH (ref 0.0–149.0)

## 2012-12-07 LAB — CBC WITH DIFFERENTIAL/PLATELET
Basophils Relative: 0.6 % (ref 0.0–3.0)
Eosinophils Absolute: 0.1 10*3/uL (ref 0.0–0.7)
Hemoglobin: 15.1 g/dL — ABNORMAL HIGH (ref 12.0–15.0)
MCHC: 34.6 g/dL (ref 30.0–36.0)
MCV: 91.2 fl (ref 78.0–100.0)
Monocytes Absolute: 0.4 10*3/uL (ref 0.1–1.0)
Neutro Abs: 3.2 10*3/uL (ref 1.4–7.7)
RBC: 4.79 Mil/uL (ref 3.87–5.11)

## 2012-12-07 LAB — BASIC METABOLIC PANEL
CO2: 28 mEq/L (ref 19–32)
Chloride: 104 mEq/L (ref 96–112)
Sodium: 139 mEq/L (ref 135–145)

## 2012-12-07 LAB — HEPATIC FUNCTION PANEL
Albumin: 4.4 g/dL (ref 3.5–5.2)
Total Protein: 7.7 g/dL (ref 6.0–8.3)

## 2012-12-07 LAB — TSH: TSH: 1.02 u[IU]/mL (ref 0.35–5.50)

## 2012-12-07 MED ORDER — LISINOPRIL 10 MG PO TABS: 10.0000 mg | ORAL_TABLET | Freq: Every day | ORAL | Status: DC

## 2012-12-07 NOTE — Progress Notes (Signed)
Subjective:     Sarah Zuniga is a 57 y.o. female and is here for a comprehensive physical exam. The patient reports no problems.  History   Social History  . Marital Status: Divorced    Spouse Name: N/A    Number of Children: N/A  . Years of Education: N/A   Occupational History  . 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 [employer]     Social History Main Topics  . Smoking status: Never Smoker   . Smokeless tobacco: Not on file  . Alcohol Use: Yes  . Drug Use: No  . Sexual Activity: Not Currently    Partners: Male   Other Topics Concern  . Not on file   Social History Narrative   Exercise-- 4x a week   Health Maintenance  Topic Date Due  . Influenza Vaccine  09/04/2012  . Mammogram  09/17/2014  . Pap Smear  12/05/2014  . Tetanus/tdap  12/14/2014  . Colonoscopy  12/21/2014    The following portions of the patient's history were reviewed and updated as appropriate:  She  has a past medical history of Hyperlipidemia and Hypertension. She  does not have any pertinent problems on file. She  has past surgical history that includes Tonsillectomy and Wisdom tooth extraction. Her family history includes Cancer in her father; Coronary artery disease in her other; Diabetes in her mother; Hyperlipidemia in her father and mother; Hypertension in her father and mother. She  reports that she has never smoked. She does not have any smokeless tobacco history on file. She reports that she drinks alcohol. She reports that she does not use illicit drugs. She has a current medication list which includes the following prescription(s): cetirizine, lisinopril, multivitamin, and fish oil. Current Outpatient Prescriptions on File Prior to Visit  Medication Sig Dispense Refill  . cetirizine (KLS ALLER-TEC) 10 MG tablet Take 10 mg by mouth daily.        Marland Kitchen lisinopril (PRINIVIL,ZESTRIL) 10 MG tablet Take 1 tablet (10 mg total) by mouth daily.  90 tablet  3  . Multiple Vitamin (MULTIVITAMIN) tablet Take 1 tablet  by mouth daily.        . Omega-3 Fatty Acids (FISH OIL) 1000 MG CAPS Take 1,000 mg by mouth daily.         No current facility-administered medications on file prior to visit.   She has No Known Allergies..  Review of Systems Review of Systems  Constitutional: Negative for activity change, appetite change and fatigue.  HENT: Negative for hearing loss, congestion, tinnitus and ear discharge.  dentist q10m Eyes: Negative for visual disturbance (see optho q1y -- vision corrected to 20/20 with glasses).  Respiratory: Negative for cough, chest tightness and shortness of breath.   Cardiovascular: Negative for chest pain, palpitations and leg swelling.  Gastrointestinal: Negative for abdominal pain, diarrhea, constipation and abdominal distention.  Genitourinary: Negative for urgency, frequency, decreased urine volume and difficulty urinating.  Musculoskeletal: Negative for back pain, arthralgias and gait problem.  Skin: Negative for color change, pallor and rash.  Neurological: Negative for dizziness, light-headedness, numbness and headaches.  Hematological: Negative for adenopathy. Does not bruise/bleed easily.  Psychiatric/Behavioral: Negative for suicidal ideas, confusion, sleep disturbance, self-injury, dysphoric mood, decreased concentration and agitation.       Objective:    BP 132/78  Pulse 68  Temp(Src) 98.4 F (36.9 C) (Oral)  Ht 5\' 6"  (1.676 m)  Wt 164 lb 3.2 oz (74.481 kg)  BMI 26.52 kg/m2  SpO2 96% General  appearance: alert, cooperative, appears stated age and no distress Head: Normocephalic, without obvious abnormality, atraumatic Eyes: conjunctivae/corneas clear. PERRL, EOM&#39;s intact. Fundi benign. Ears: normal TM&#39;s and external ear canals both ears Nose: Nares normal. Septum midline. Mucosa normal. No drainage or sinus tenderness. Throat: lips, mucosa, and tongue normal; teeth and gums normal Neck: no adenopathy, no carotid bruit, no JVD, supple, symmetrical,  trachea midline and thyroid not enlarged, symmetric, no tenderness/mass/nodules Back: symmetric, no curvature. ROM normal. No CVA tenderness. Lungs: clear to auscultation bilaterally Breasts: normal appearance, no masses or tenderness Heart: regular rate and rhythm, S1, S2 normal, no murmur, click, rub or gallop Abdomen: soft, non-tender; bowel sounds normal; no masses,  no organomegaly Pelvic: deferred---next year Extremities: extremities normal, atraumatic, no cyanosis or edema Pulses: 2+ and symmetric Skin: Skin color, texture, turgor normal. No rashes or lesions Lymph nodes: Cervical, supraclavicular, and axillary nodes normal. Neurologic: Alert and oriented X 3, normal strength and tone. Normal symmetric reflexes. Normal coordination and gait Psych- no depression, no anxiety      Assessment:    Healthy female exam.      Plan:  ghm utd Check labs    See After Visit Summary for Counseling Recommendations

## 2012-12-07 NOTE — Patient Instructions (Signed)
Preventive Care for Adults, Female A healthy lifestyle and preventive care can promote health and wellness. Preventive health guidelines for women include the following key practices.  A routine yearly physical is a good way to check with your caregiver about your health and preventive screening. It is a chance to share any concerns and updates on your health, and to receive a thorough exam.  Visit your dentist for a routine exam and preventive care every 6 months. Brush your teeth twice a day and floss once a day. Good oral hygiene prevents tooth decay and gum disease.  The frequency of eye exams is based on your age, health, family medical history, use of contact lenses, and other factors. Follow your caregiver's recommendations for frequency of eye exams.  Eat a healthy diet. Foods like vegetables, fruits, whole grains, low-fat dairy products, and lean protein foods contain the nutrients you need without too many calories. Decrease your intake of foods high in solid fats, added sugars, and salt. Eat the right amount of calories for you.Get information about a proper diet from your caregiver, if necessary.  Regular physical exercise is one of the most important things you can do for your health. Most adults should get at least 150 minutes of moderate-intensity exercise (any activity that increases your heart rate and causes you to sweat) each week. In addition, most adults need muscle-strengthening exercises on 2 or more days a week.  Maintain a healthy weight. The body mass index (BMI) is a screening tool to identify possible weight problems. It provides an estimate of body fat based on height and weight. Your caregiver can help determine your BMI, and can help you achieve or maintain a healthy weight.For adults 20 years and older:  A BMI below 18.5 is considered underweight.  A BMI of 18.5 to 24.9 is normal.  A BMI of 25 to 29.9 is considered overweight.  A BMI of 30 and above is  considered obese.  Maintain normal blood lipids and cholesterol levels by exercising and minimizing your intake of saturated fat. Eat a balanced diet with plenty of fruit and vegetables. Blood tests for lipids and cholesterol should begin at age 20 and be repeated every 5 years. If your lipid or cholesterol levels are high, you are over 50, or you are at high risk for heart disease, you may need your cholesterol levels checked more frequently.Ongoing high lipid and cholesterol levels should be treated with medicines if diet and exercise are not effective.  If you smoke, find out from your caregiver how to quit. If you do not use tobacco, do not start.  If you are pregnant, do not drink alcohol. If you are breastfeeding, be very cautious about drinking alcohol. If you are not pregnant and choose to drink alcohol, do not exceed 1 drink per day. One drink is considered to be 12 ounces (355 mL) of beer, 5 ounces (148 mL) of wine, or 1.5 ounces (44 mL) of liquor.  Avoid use of street drugs. Do not share needles with anyone. Ask for help if you need support or instructions about stopping the use of drugs.  High blood pressure causes heart disease and increases the risk of stroke. Your blood pressure should be checked at least every 1 to 2 years. Ongoing high blood pressure should be treated with medicines if weight loss and exercise are not effective.  If you are 55 to 57 years old, ask your caregiver if you should take aspirin to prevent strokes.  Diabetes   screening involves taking a blood sample to check your fasting blood sugar level. This should be done once every 3 years, after age 45, if you are within normal weight and without risk factors for diabetes. Testing should be considered at a younger age or be carried out more frequently if you are overweight and have at least 1 risk factor for diabetes.  Breast cancer screening is essential preventive care for women. You should practice "breast  self-awareness." This means understanding the normal appearance and feel of your breasts and may include breast self-examination. Any changes detected, no matter how small, should be reported to a caregiver. Women in their 20s and 30s should have a clinical breast exam (CBE) by a caregiver as part of a regular health exam every 1 to 3 years. After age 40, women should have a CBE every year. Starting at age 40, women should consider having a mammography (breast X-ray test) every year. Women who have a family history of breast cancer should talk to their caregiver about genetic screening. Women at a high risk of breast cancer should talk to their caregivers about having magnetic resonance imaging (MRI) and a mammography every year.  The Pap test is a screening test for cervical cancer. A Pap test can show cell changes on the cervix that might become cervical cancer if left untreated. A Pap test is a procedure in which cells are obtained and examined from the lower end of the uterus (cervix).  Women should have a Pap test starting at age 21.  Between ages 21 and 29, Pap tests should be repeated every 2 years.  Beginning at age 30, you should have a Pap test every 3 years as long as the past 3 Pap tests have been normal.  Some women have medical problems that increase the chance of getting cervical cancer. Talk to your caregiver about these problems. It is especially important to talk to your caregiver if a new problem develops soon after your last Pap test. In these cases, your caregiver may recommend more frequent screening and Pap tests.  The above recommendations are the same for women who have or have not gotten the vaccine for human papillomavirus (HPV).  If you had a hysterectomy for a problem that was not cancer or a condition that could lead to cancer, then you no longer need Pap tests. Even if you no longer need a Pap test, a regular exam is a good idea to make sure no other problems are  starting.  If you are between ages 65 and 70, and you have had normal Pap tests going back 10 years, you no longer need Pap tests. Even if you no longer need a Pap test, a regular exam is a good idea to make sure no other problems are starting.  If you have had past treatment for cervical cancer or a condition that could lead to cancer, you need Pap tests and screening for cancer for at least 20 years after your treatment.  If Pap tests have been discontinued, risk factors (such as a new sexual partner) need to be reassessed to determine if screening should be resumed.  The HPV test is an additional test that may be used for cervical cancer screening. The HPV test looks for the virus that can cause the cell changes on the cervix. The cells collected during the Pap test can be tested for HPV. The HPV test could be used to screen women aged 30 years and older, and should   be used in women of any age who have unclear Pap test results. After the age of 30, women should have HPV testing at the same frequency as a Pap test.  Colorectal cancer can be detected and often prevented. Most routine colorectal cancer screening begins at the age of 50 and continues through age 75. However, your caregiver may recommend screening at an earlier age if you have risk factors for colon cancer. On a yearly basis, your caregiver may provide home test kits to check for hidden blood in the stool. Use of a small camera at the end of a tube, to directly examine the colon (sigmoidoscopy or colonoscopy), can detect the earliest forms of colorectal cancer. Talk to your caregiver about this at age 50, when routine screening begins. Direct examination of the colon should be repeated every 5 to 10 years through age 75, unless early forms of pre-cancerous polyps or small growths are found.  Hepatitis C blood testing is recommended for all people born from 1945 through 1965 and any individual with known risks for hepatitis C.  Practice  safe sex. Use condoms and avoid high-risk sexual practices to reduce the spread of sexually transmitted infections (STIs). STIs include gonorrhea, chlamydia, syphilis, trichomonas, herpes, HPV, and human immunodeficiency virus (HIV). Herpes, HIV, and HPV are viral illnesses that have no cure. They can result in disability, cancer, and death. Sexually active women aged 25 and younger should be checked for chlamydia. Older women with new or multiple partners should also be tested for chlamydia. Testing for other STIs is recommended if you are sexually active and at increased risk.  Osteoporosis is a disease in which the bones lose minerals and strength with aging. This can result in serious bone fractures. The risk of osteoporosis can be identified using a bone density scan. Women ages 65 and over and women at risk for fractures or osteoporosis should discuss screening with their caregivers. Ask your caregiver whether you should take a calcium supplement or vitamin D to reduce the rate of osteoporosis.  Menopause can be associated with physical symptoms and risks. Hormone replacement therapy is available to decrease symptoms and risks. You should talk to your caregiver about whether hormone replacement therapy is right for you.  Use sunscreen with sun protection factor (SPF) of 30 or more. Apply sunscreen liberally and repeatedly throughout the day. You should seek shade when your shadow is shorter than you. Protect yourself by wearing long sleeves, pants, a wide-brimmed hat, and sunglasses year round, whenever you are outdoors.  Once a month, do a whole body skin exam, using a mirror to look at the skin on your back. Notify your caregiver of new moles, moles that have irregular borders, moles that are larger than a pencil eraser, or moles that have changed in shape or color.  Stay current with required immunizations.  Influenza. You need a dose every fall (or winter). The composition of the flu vaccine  changes each year, so being vaccinated once is not enough.  Pneumococcal polysaccharide. You need 1 to 2 doses if you smoke cigarettes or if you have certain chronic medical conditions. You need 1 dose at age 65 (or older) if you have never been vaccinated.  Tetanus, diphtheria, pertussis (Tdap, Td). Get 1 dose of Tdap vaccine if you are younger than age 65, are over 65 and have contact with an infant, are a healthcare worker, are pregnant, or simply want to be protected from whooping cough. After that, you need a Td   booster dose every 10 years. Consult your caregiver if you have not had at least 3 tetanus and diphtheria-containing shots sometime in your life or have a deep or dirty wound.  HPV. You need this vaccine if you are a woman age 26 or younger. The vaccine is given in 3 doses over 6 months.  Measles, mumps, rubella (MMR). You need at least 1 dose of MMR if you were born in 1957 or later. You may also need a second dose.  Meningococcal. If you are age 19 to 21 and a first-year college student living in a residence hall, or have one of several medical conditions, you need to get vaccinated against meningococcal disease. You may also need additional booster doses.  Zoster (shingles). If you are age 60 or older, you should get this vaccine.  Varicella (chickenpox). If you have never had chickenpox or you were vaccinated but received only 1 dose, talk to your caregiver to find out if you need this vaccine.  Hepatitis A. You need this vaccine if you have a specific risk factor for hepatitis A virus infection or you simply wish to be protected from this disease. The vaccine is usually given as 2 doses, 6 to 18 months apart.  Hepatitis B. You need this vaccine if you have a specific risk factor for hepatitis B virus infection or you simply wish to be protected from this disease. The vaccine is given in 3 doses, usually over 6 months. Preventive Services / Frequency Ages 19 to 39  Blood  pressure check.** / Every 1 to 2 years.  Lipid and cholesterol check.** / Every 5 years beginning at age 20.  Clinical breast exam.** / Every 3 years for women in their 20s and 30s.  Pap test.** / Every 2 years from ages 21 through 29. Every 3 years starting at age 30 through age 65 or 70 with a history of 3 consecutive normal Pap tests.  HPV screening.** / Every 3 years from ages 30 through ages 65 to 70 with a history of 3 consecutive normal Pap tests.  Hepatitis C blood test.** / For any individual with known risks for hepatitis C.  Skin self-exam. / Monthly.  Influenza immunization.** / Every year.  Pneumococcal polysaccharide immunization.** / 1 to 2 doses if you smoke cigarettes or if you have certain chronic medical conditions.  Tetanus, diphtheria, pertussis (Tdap, Td) immunization. / A one-time dose of Tdap vaccine. After that, you need a Td booster dose every 10 years.  HPV immunization. / 3 doses over 6 months, if you are 26 and younger.  Measles, mumps, rubella (MMR) immunization. / You need at least 1 dose of MMR if you were born in 1957 or later. You may also need a second dose.  Meningococcal immunization. / 1 dose if you are age 19 to 21 and a first-year college student living in a residence hall, or have one of several medical conditions, you need to get vaccinated against meningococcal disease. You may also need additional booster doses.  Varicella immunization.** / Consult your caregiver.  Hepatitis A immunization.** / Consult your caregiver. 2 doses, 6 to 18 months apart.  Hepatitis B immunization.** / Consult your caregiver. 3 doses usually over 6 months. Ages 40 to 64  Blood pressure check.** / Every 1 to 2 years.  Lipid and cholesterol check.** / Every 5 years beginning at age 20.  Clinical breast exam.** / Every year after age 40.  Mammogram.** / Every year beginning at age 40   and continuing for as long as you are in good health. Consult with your  caregiver.  Pap test.** / Every 3 years starting at age 30 through age 65 or 70 with a history of 3 consecutive normal Pap tests.  HPV screening.** / Every 3 years from ages 30 through ages 65 to 70 with a history of 3 consecutive normal Pap tests.  Fecal occult blood test (FOBT) of stool. / Every year beginning at age 50 and continuing until age 75. You may not need to do this test if you get a colonoscopy every 10 years.  Flexible sigmoidoscopy or colonoscopy.** / Every 5 years for a flexible sigmoidoscopy or every 10 years for a colonoscopy beginning at age 50 and continuing until age 75.  Hepatitis C blood test.** / For all people born from 1945 through 1965 and any individual with known risks for hepatitis C.  Skin self-exam. / Monthly.  Influenza immunization.** / Every year.  Pneumococcal polysaccharide immunization.** / 1 to 2 doses if you smoke cigarettes or if you have certain chronic medical conditions.  Tetanus, diphtheria, pertussis (Tdap, Td) immunization.** / A one-time dose of Tdap vaccine. After that, you need a Td booster dose every 10 years.  Measles, mumps, rubella (MMR) immunization. / You need at least 1 dose of MMR if you were born in 1957 or later. You may also need a second dose.  Varicella immunization.** / Consult your caregiver.  Meningococcal immunization.** / Consult your caregiver.  Hepatitis A immunization.** / Consult your caregiver. 2 doses, 6 to 18 months apart.  Hepatitis B immunization.** / Consult your caregiver. 3 doses, usually over 6 months. Ages 65 and over  Blood pressure check.** / Every 1 to 2 years.  Lipid and cholesterol check.** / Every 5 years beginning at age 20.  Clinical breast exam.** / Every year after age 40.  Mammogram.** / Every year beginning at age 40 and continuing for as long as you are in good health. Consult with your caregiver.  Pap test.** / Every 3 years starting at age 30 through age 65 or 70 with a 3  consecutive normal Pap tests. Testing can be stopped between 65 and 70 with 3 consecutive normal Pap tests and no abnormal Pap or HPV tests in the past 10 years.  HPV screening.** / Every 3 years from ages 30 through ages 65 or 70 with a history of 3 consecutive normal Pap tests. Testing can be stopped between 65 and 70 with 3 consecutive normal Pap tests and no abnormal Pap or HPV tests in the past 10 years.  Fecal occult blood test (FOBT) of stool. / Every year beginning at age 50 and continuing until age 75. You may not need to do this test if you get a colonoscopy every 10 years.  Flexible sigmoidoscopy or colonoscopy.** / Every 5 years for a flexible sigmoidoscopy or every 10 years for a colonoscopy beginning at age 50 and continuing until age 75.  Hepatitis C blood test.** / For all people born from 1945 through 1965 and any individual with known risks for hepatitis C.  Osteoporosis screening.** / A one-time screening for women ages 65 and over and women at risk for fractures or osteoporosis.  Skin self-exam. / Monthly.  Influenza immunization.** / Every year.  Pneumococcal polysaccharide immunization.** / 1 dose at age 65 (or older) if you have never been vaccinated.  Tetanus, diphtheria, pertussis (Tdap, Td) immunization. / A one-time dose of Tdap vaccine if you are over   65 and have contact with an infant, are a healthcare worker, or simply want to be protected from whooping cough. After that, you need a Td booster dose every 10 years.  Varicella immunization.** / Consult your caregiver.  Meningococcal immunization.** / Consult your caregiver.  Hepatitis A immunization.** / Consult your caregiver. 2 doses, 6 to 18 months apart.  Hepatitis B immunization.** / Check with your caregiver. 3 doses, usually over 6 months. ** Family history and personal history of risk and conditions may change your caregiver's recommendations. Document Released: 03/19/2001 Document Revised: 04/15/2011  Document Reviewed: 06/18/2010 [employer] Patient Information 2014 [employer].  

## 2012-12-07 NOTE — Assessment & Plan Note (Signed)
Pt willing to take meds if needed Check labs

## 2012-12-07 NOTE — Assessment & Plan Note (Signed)
Stable con't meds 

## 2012-12-08 LAB — POCT URINALYSIS DIPSTICK
Ketones, UA: NEGATIVE
Protein, UA: NEGATIVE
Spec Grav, UA: >=1.03
pH, UA: 6

## 2013-04-19 ENCOUNTER — Encounter: Payer: Self-pay | Admitting: Family Medicine

## 2013-04-19 ENCOUNTER — Ambulatory Visit (INDEPENDENT_AMBULATORY_CARE_PROVIDER_SITE_OTHER): Payer: 59 | Admitting: Family Medicine

## 2013-04-19 VITALS — BP 132/90 | HR 80 | Temp 98.1°F | Wt 168.0 lb

## 2013-04-19 DIAGNOSIS — I10 Essential (primary) hypertension: Secondary | ICD-10-CM

## 2013-04-19 DIAGNOSIS — H538 Other visual disturbances: Secondary | ICD-10-CM

## 2013-04-19 LAB — CBC WITH DIFFERENTIAL/PLATELET
BASOS ABS: 0 10*3/uL (ref 0.0–0.1)
Basophils Relative: 0.5 % (ref 0.0–3.0)
EOS ABS: 0.1 10*3/uL (ref 0.0–0.7)
Eosinophils Relative: 2 % (ref 0.0–5.0)
HCT: 44.1 % (ref 36.0–46.0)
Hemoglobin: 15.1 g/dL — ABNORMAL HIGH (ref 12.0–15.0)
LYMPHS PCT: 31.3 % (ref 12.0–46.0)
Lymphs Abs: 1.6 10*3/uL (ref 0.7–4.0)
MCHC: 34.2 g/dL (ref 30.0–36.0)
MCV: 92.5 fl (ref 78.0–100.0)
MONOS PCT: 9 % (ref 3.0–12.0)
Monocytes Absolute: 0.4 10*3/uL (ref 0.1–1.0)
NEUTROS PCT: 57.2 % (ref 43.0–77.0)
Neutro Abs: 2.8 10*3/uL (ref 1.4–7.7)
Platelets: 197 10*3/uL (ref 150.0–400.0)
RBC: 4.77 Mil/uL (ref 3.87–5.11)
RDW: 13 % (ref 11.5–14.6)
WBC: 5 10*3/uL (ref 4.5–10.5)

## 2013-04-19 LAB — BASIC METABOLIC PANEL
BUN: 21 mg/dL (ref 6–23)
CALCIUM: 9.1 mg/dL (ref 8.4–10.5)
CO2: 26 meq/L (ref 19–32)
CREATININE: 0.7 mg/dL (ref 0.4–1.2)
Chloride: 108 mEq/L (ref 96–112)
GFR: 93.13 mL/min (ref >60.00)
GLUCOSE: 99 mg/dL (ref 70–99)
Potassium: 3.8 mEq/L (ref 3.5–5.1)
Sodium: 139 mEq/L (ref 135–145)

## 2013-04-19 LAB — HEPATIC FUNCTION PANEL
ALK PHOS: 49 U/L (ref 39–117)
ALT: 25 U/L (ref 0–35)
AST: 27 U/L (ref 0–37)
Albumin: 4.4 g/dL (ref 3.5–5.2)
BILIRUBIN DIRECT: 0 mg/dL (ref 0.0–0.3)
Total Bilirubin: 0.7 mg/dL (ref 0.3–1.2)
Total Protein: 7.4 g/dL (ref 6.0–8.3)

## 2013-04-19 LAB — POCT URINALYSIS DIPSTICK
BILIRUBIN UA: NEGATIVE
Glucose, UA: NEGATIVE
Ketones, UA: NEGATIVE
LEUKOCYTES UA: NEGATIVE
Nitrite, UA: NEGATIVE
PH UA: 6
PROTEIN UA: NEGATIVE
RBC UA: NEGATIVE
Urobilinogen, UA: 0.2

## 2013-04-19 LAB — LIPID PANEL
Cholesterol: 282 mg/dL — ABNORMAL HIGH (ref 0–200)
HDL: 50.7 mg/dL
LDL Cholesterol: 201 mg/dL — ABNORMAL HIGH (ref 0–99)
Total CHOL/HDL Ratio: 6
Triglycerides: 153 mg/dL — ABNORMAL HIGH (ref 0.0–149.0)
VLDL: 30.6 mg/dL (ref 0.0–40.0)

## 2013-04-19 LAB — VITAMIN B12: Vitamin B-12: 284 pg/mL (ref 211–911)

## 2013-04-19 LAB — TSH: TSH: 0.8 u[IU]/mL (ref 0.35–5.50)

## 2013-04-19 MED ORDER — LISINOPRIL 20 MG PO TABS: 20.0000 mg | ORAL_TABLET | Freq: Every day | ORAL | Status: DC

## 2013-04-19 NOTE — Progress Notes (Signed)
Patient ID: Bryelle Spiewak, female   DOB: 24-Sep-1955, 58 y.o.   MRN: [id number]   Subjective:    Patient ID: [name], female    DOB: 1955-12-02, 58 y.o.   MRN: [id number] HPI Pt here c/o blurry vision in one eye only.  She did see the optomitrist who found nothing wrong.  No other symptoms.           Objective:    BP 132/90  Pulse 80  Temp(Src) 98.1 F (36.7 C) (Oral)  Wt 168 lb (76.204 kg)  SpO2 98% General appearance: alert, cooperative, appears stated age and no distress Eyes: negative findings: lids and lashes normal and pupils equal, round, reactive to light and accomodation Neck: no adenopathy, supple, symmetrical, trachea midline and thyroid not enlarged, symmetric, no tenderness/mass/nodules Lungs: clear to auscultation bilaterally Heart: S1, S2 normal Extremities: extremities normal, atraumatic, no cyanosis or edema        Assessment & Plan:  1. HTN (hypertension) Slightly elevated Inc lisnopril to 20 mg---rto 2-3 weeks  - lisinopril (PRINIVIL,ZESTRIL) 20 MG tablet; Take 1 tablet (20 mg total) by mouth daily.  Dispense: 90 tablet; Refill: 3 - Basic metabolic panel - CBC with Differential - Hepatic function panel - Lipid panel - POCT urinalysis dipstick  2. Blurry vision Refer to neuro for eval - Basic metabolic panel - CBC with Differential - Hepatic function panel - Lipid panel - POCT urinalysis dipstick - TSH - Vitamin B12 - Ambulatory referral to Neurology

## 2013-04-19 NOTE — Progress Notes (Signed)
Pre visit review using our clinic review tool, if applicable. No additional management support is needed unless otherwise documented below in the visit note. 

## 2013-04-19 NOTE — Patient Instructions (Signed)
Blurred Vision °You have been seen today complaining of blurred vision. This means you have a loss of ability to see small details.  °CAUSES  °Blurred vision can be a symptom of underlying eye problems, such as: °· Aging of the eye (presbyopia). °· Glaucoma. °· Cataracts. °· Eye infection. °· Eye-related migraine. °· Diabetes mellitus. °· Fatigue. °· Migraine headaches. °· High blood pressure. °· Breakdown of the back of the eye (macular degeneration). °· Problems caused by some medications. °The most common cause of blurred vision is the need for eyeglasses or a new prescription. Today in the emergency department, no cause for your blurred vision can be found. °SYMPTOMS  °Blurred vision is the loss of visual sharpness and detail (acuity). °DIAGNOSIS  °Should blurred vision continue, you should see your caregiver. If your caregiver is your primary care physician, he or she may choose to refer you to another specialist.  °TREATMENT  °Do not ignore your blurred vision. Make sure to have it checked out to see if further treatment or referral is necessary. °SEEK MEDICAL CARE IF:  °You are unable to get into a specialist so we can help you with a referral. °SEEK IMMEDIATE MEDICAL CARE IF: °You have severe eye pain, severe headache, or sudden loss of vision. °MAKE SURE YOU:  °· Understand these instructions. °· Will watch your condition. °· Will get help right away if you are not doing well or get worse. °Document Released: 01/24/2003 Document Revised: 04/15/2011 Document Reviewed: 08/26/2007 °[employer]® Patient Information ©2014 [employer]. ° °

## 2013-04-20 ENCOUNTER — Telehealth: Payer: Self-pay | Admitting: Family Medicine

## 2013-04-20 NOTE — Telephone Encounter (Signed)
Relevant patient education assigned to patient using Emmi. ° °

## 2013-04-23 ENCOUNTER — Other Ambulatory Visit: Payer: Self-pay

## 2013-04-23 MED ORDER — SIMVASTATIN 20 MG PO TABS: 20.0000 mg | ORAL_TABLET | Freq: Every day | ORAL | Status: DC

## 2013-04-28 ENCOUNTER — Ambulatory Visit (INDEPENDENT_AMBULATORY_CARE_PROVIDER_SITE_OTHER): Payer: 59 | Admitting: Neurology

## 2013-04-28 ENCOUNTER — Encounter: Payer: Self-pay | Admitting: Neurology

## 2013-04-28 VITALS — BP 139/93 | HR 80 | Ht 65.5 in | Wt 171.0 lb

## 2013-04-28 DIAGNOSIS — H532 Diplopia: Secondary | ICD-10-CM

## 2013-04-28 HISTORY — DX: Diplopia: H53.2

## 2013-04-28 NOTE — Progress Notes (Signed)
Reason for visit: Monocular double vision  Sarah Zuniga is a 58 y.o. female  History of present illness:  Sarah Zuniga is a 58 year old right-handed white female with a history of double vision involving the left eye only that began at some point in the last several months. The patient is not sure exactly when the problem first began. The patient has noted some slight alteration or distortion of vision. The patient went to her optometrist, and during that examination, it became apparent that she was seeing double in the left eye only. The patient has not had any tilting or distortion of vision, and she has not had any problems with washout of colors. The patient reports no eye pain. The patient has no headaches, numbness or weakness of the extremities or face, balance issues, or problems controlling the bowels or the bladder. The patient does not report any cognitive decline. The patient was seen by her primary care physician, and some blood work was drawn to include a vitamin B12 level. The patient is sent to this office for an evaluation. The patient has not been seeing double with the right eye.  Past Medical History  Diagnosis Date  . Hyperlipidemia   . Hypertension   . Diplopia 04/28/2013    Monocular , left    Past Surgical History  Procedure Laterality Date  . Tonsillectomy    . Wisdom tooth extraction      Family History  Problem Relation Age of Onset  . Coronary artery disease Other     1st degree relative female <50.Marland KitchenMarland Kitchenfemale <60  . Diabetes Mother   . Hypertension Mother   . Hyperlipidemia Mother   . Stroke Mother   . Cancer Father     lung  . Hyperlipidemia Father   . Hypertension Father   . Hyperlipidemia Sister   . Hypertension Sister     Social history:  reports that she has never smoked. She has never used smokeless tobacco. She reports that she drinks alcohol. She reports that she does not use illicit drugs.  Medications:  Current Outpatient Prescriptions  on File Prior to Visit  Medication Sig Dispense Refill  . cetirizine (KLS ALLER-TEC) 10 MG tablet Take 10 mg by mouth daily.        Marland Kitchen lisinopril (PRINIVIL,ZESTRIL) 20 MG tablet Take 1 tablet (20 mg total) by mouth daily.  90 tablet  3  . Multiple Vitamin (MULTIVITAMIN) tablet Take 1 tablet by mouth daily.        . Omega-3 Fatty Acids (FISH OIL) 1000 MG CAPS Take 1,000 mg by mouth daily.        . simvastatin (ZOCOR) 20 MG tablet Take 1 tablet (20 mg total) by mouth at bedtime.  30 tablet  2   No current facility-administered medications on file prior to visit.     No Known Allergies  ROS:  Out of a complete 14 system review of symptoms, the patient complains only of the following symptoms, and all other reviewed systems are negative.  Double vision Insomnia  Blood pressure 139/93, pulse 80, height 5' 5.5" (1.664 m), weight 171 lb (77.565 kg).  Physical Exam  General: The patient is alert and cooperative at the time of the examination.  Eyes: Pupils are equal, round, and reactive to light. Discs are flat bilaterally.  Neck: The neck is supple, no carotid bruits are noted.  Respiratory: The respiratory examination is clear.  Cardiovascular: The cardiovascular examination reveals a regular rate and rhythm, no obvious  murmurs or rubs are noted.  Skin: Extremities are without significant edema.  Neurologic Exam  Mental status: The patient is alert and oriented x 3 at the time of the examination. The patient has apparent normal recent and remote memory, with an apparently normal attention span and concentration ability.  Cranial nerves: Facial symmetry is present. There is good sensation of the face to pinprick and soft touch bilaterally. The strength of the facial muscles and the muscles to head turning and shoulder shrug are normal bilaterally. Speech is well enunciated, no aphasia or dysarthria is noted. Extraocular movements are full. Visual fields are full. The tongue is  midline, and the patient has symmetric elevation of the soft palate. No obvious hearing deficits are noted.  Motor: The motor testing reveals 5 over 5 strength of all 4 extremities. Good symmetric motor tone is noted throughout.  Sensory: Sensory testing is intact to pinprick, soft touch, vibration sensation, and position sense on all 4 extremities. No evidence of extinction is noted.  Coordination: Cerebellar testing reveals good finger-nose-finger and heel-to-shin bilaterally.  Gait and station: Gait is normal. Tandem gait is normal. Romberg is negative. No drift is seen.  Reflexes: Deep tendon reflexes are symmetric and normal bilaterally. Toes are downgoing bilaterally.   Assessment/Plan:  1. Monocular diplopia, OS  The patient has a normal clinical examination. The patient reports double vision in a vertical plane involving the left eye only. This likely is associated with intraocular issues such as a lens dislocation, possible cataract, or macular disease. The patient will require an ophthalmologic evaluation. The patient will be set up for a visual evoked response test, but her description of the issue does not appear to be consistent with optic neuritis. The patient will followup if needed.  Jill Alexanders MD 04/28/2013 7:23 PM  [facility] [address] [place], [place] [address]  Phone [phone] Fax [phone]

## 2013-08-26 ENCOUNTER — Ambulatory Visit (INDEPENDENT_AMBULATORY_CARE_PROVIDER_SITE_OTHER): Payer: 59

## 2013-08-26 ENCOUNTER — Ambulatory Visit (INDEPENDENT_AMBULATORY_CARE_PROVIDER_SITE_OTHER): Payer: 59 | Admitting: Family Medicine

## 2013-08-26 VITALS — BP 132/86 | HR 69 | Temp 98.0°F | Resp 18 | Wt 171.0 lb

## 2013-08-26 DIAGNOSIS — M25579 Pain in unspecified ankle and joints of unspecified foot: Secondary | ICD-10-CM

## 2013-08-26 DIAGNOSIS — M25572 Pain in left ankle and joints of left foot: Secondary | ICD-10-CM

## 2013-08-26 DIAGNOSIS — S99929A Unspecified injury of unspecified foot, initial encounter: Secondary | ICD-10-CM

## 2013-08-26 DIAGNOSIS — S8990XA Unspecified injury of unspecified lower leg, initial encounter: Secondary | ICD-10-CM

## 2013-08-26 DIAGNOSIS — S93409A Sprain of unspecified ligament of unspecified ankle, initial encounter: Secondary | ICD-10-CM

## 2013-08-26 DIAGNOSIS — S99922A Unspecified injury of left foot, initial encounter: Secondary | ICD-10-CM

## 2013-08-26 DIAGNOSIS — S99919A Unspecified injury of unspecified ankle, initial encounter: Secondary | ICD-10-CM

## 2013-08-26 NOTE — Patient Instructions (Addendum)
RICE:  R: Rest is achieved by limiting weightbearing. Use crutches until you are able to walk with a normal gait. I:   Ice or cold water immersion for 15 to 20 minutes every two to three hours for the first 48 hours or until swelling is improved, whichever comes first. C: Compression with an elastic bandage to minimize swelling should be applied early. E: Elevation - The injured ankle should be kept elevated above the level of the heart to further alleviate swelling.  Nonsteroidal antiinflammatory drugs (NSAIDs) can be used; no particular NSAID has been shown to be superior so you can using whatever you have at home - ibuprofen, aleve, motrin, advil, etc.    Exercises including pointing and flexing the foot and doing foot circles should be started early, once acute pain and swelling subside, to maintain range of motion. The intensity of rehabilitation is increased gradually.  Ankle splints or braces can limit extremes of joint motion and allow early weightbearing while protecting against reinjury.     Acute Ankle Sprain with Phase I Rehab An acute ankle sprain is a partial or complete tear in one or more of the ligaments of the ankle due to traumatic injury. The severity of the injury depends on both the number of ligaments sprained and the grade of sprain. There are 3 grades of sprains.   A grade 1 sprain is a mild sprain. There is a slight pull without obvious tearing. There is no loss of strength, and the muscle and ligament are the correct length.  A grade 2 sprain is a moderate sprain. There is tearing of fibers within the substance of the ligament where it connects two bones or two cartilages. The length of the ligament is increased, and there is usually decreased strength.  A grade 3 sprain is a complete rupture of the ligament and is uncommon. In addition to the grade of sprain, there are three types of ankle sprains.  Lateral ankle sprains: This is a sprain of one or more of the three  ligaments on the outer side (lateral) of the ankle. These are the most common sprains. Medial ankle sprains: There is one large triangular ligament of the inner side (medial) of the ankle that is susceptible to injury. Medial ankle sprains are less common. Syndesmosis, "high ankle," sprains: The syndesmosis is the ligament that connects the two bones of the lower leg. Syndesmosis sprains usually only occur with very severe ankle sprains. SYMPTOMS  Pain, tenderness, and swelling in the ankle, starting at the side of injury that may progress to the whole ankle and foot with time.  "Pop" or tearing sensation at the time of injury.  Bruising that may spread to the heel.  Impaired ability to walk soon after injury. CAUSES   Acute ankle sprains are caused by trauma placed on the ankle that temporarily forces or pries the anklebone (talus) out of its normal socket.  Stretching or tearing of the ligaments that normally hold the joint in place (usually due to a twisting injury). RISK INCREASES WITH:  Previous ankle sprain.  Sports in which the foot may land awkwardly (i.e., basketball, volleyball, or soccer) or walking or running on uneven or rough surfaces.  Shoes with inadequate support to prevent sideways motion when stress occurs.  Poor strength and flexibility.  Poor balance skills.  Contact sports. PREVENTION   Warm up and stretch properly before activity.  Maintain physical fitness:  Ankle and leg flexibility, muscle strength, and endurance.  Cardiovascular fitness.  Balance training activities.  Use proper technique and have a coach correct improper technique.  Taping, protective strapping, bracing, or high-top tennis shoes may help prevent injury. Initially, tape is best; however, it loses most of its support function within 10 to 15 minutes.  Wear proper-fitted protective shoes (High-top shoes with taping or bracing is more effective than either alone).  Provide the  ankle with support during sports and practice activities for 12 months following injury. PROGNOSIS   If treated properly, ankle sprains can be expected to recover completely; however, the length of recovery depends on the degree of injury.  A grade 1 sprain usually heals enough in 5 to 7 days to allow modified activity and requires an average of 6 weeks to heal completely.  A grade 2 sprain requires 6 to 10 weeks to heal completely.  A grade 3 sprain requires 12 to 16 weeks to heal.  A syndesmosis sprain often takes more than 3 months to heal. RELATED COMPLICATIONS   Frequent recurrence of symptoms may result in a chronic problem. Appropriately addressing the problem the first time decreases the frequency of recurrence and optimizes healing time. Severity of the initial sprain does not predict the likelihood of later instability.  Injury to other structures (bone, cartilage, or tendon).  A chronically unstable or arthritic ankle joint is a possibility with repeated sprains. TREATMENT Treatment initially involves the use of ice, medication, and compression bandages to help reduce pain and inflammation. Ankle sprains are usually immobilized in a walking cast or boot to allow for healing. Crutches may be recommended to reduce pressure on the injury. After immobilization, strengthening and stretching exercises may be necessary to regain strength and a full range of motion. Surgery is rarely needed to treat ankle sprains. MEDICATION   Nonsteroidal anti-inflammatory medications, such as aspirin and ibuprofen (do not take for the first 3 days after injury or within 7 days before surgery), or other minor pain relievers, such as acetaminophen, are often recommended. Take these as directed by your caregiver. Contact your caregiver immediately if any bleeding, stomach upset, or signs of an allergic reaction occur from these medications.  Ointments applied to the skin may be helpful.  Pain relievers  may be prescribed as necessary by your caregiver. Do not take prescription pain medication for longer than 4 to 7 days. Use only as directed and only as much as you need. HEAT AND COLD  Cold treatment (icing) is used to relieve pain and reduce inflammation for acute and chronic cases. Cold should be applied for 10 to 15 minutes every 2 to 3 hours for inflammation and pain and immediately after any activity that aggravates your symptoms. Use ice packs or an ice massage.  Heat treatment may be used before performing stretching and strengthening activities prescribed by your caregiver. Use a heat pack or a warm soak. SEEK IMMEDIATE MEDICAL CARE IF:   Pain, swelling, or bruising worsens despite treatment.  You experience pain, numbness, discoloration, or coldness in the foot or toes.  New, unexplained symptoms develop (drugs used in treatment may produce side effects.) EXERCISES  PHASE I EXERCISES RANGE OF MOTION (ROM) AND STRETCHING EXERCISES - Ankle Sprain, Acute Phase I, Weeks 1 to 2 These exercises may help you when beginning to restore flexibility in your ankle. You will likely work on these exercises for the 1 to 2 weeks after your injury. Once your physician, physical therapist, or athletic trainer sees adequate progress, he or she will advance your exercises. While completing  these exercises, remember:   Restoring tissue flexibility helps normal motion to return to the joints. This allows healthier, less painful movement and activity.  An effective stretch should be held for at least 30 seconds.  A stretch should never be painful. You should only feel a gentle lengthening or release in the stretched tissue. RANGE OF MOTION - Dorsi/Plantar Flexion  While sitting with your right / left knee straight, draw the top of your foot upwards by flexing your ankle. Then reverse the motion, pointing your toes downward.  Hold each position for __________ seconds.  After completing your first set  of exercises, repeat this exercise with your knee bent. Repeat __________ times. Complete this exercise __________ times per day.  RANGE OF MOTION - Ankle Alphabet  Imagine your right / left big toe is a pen.  Keeping your hip and knee still, write out the entire alphabet with your "pen." Make the letters as large as you can without increasing any discomfort. Repeat __________ times. Complete this exercise __________ times per day.  STRENGTHENING EXERCISES - Ankle Sprain, Acute -Phase I, Weeks 1 to 2 These exercises may help you when beginning to restore strength in your ankle. You will likely work on these exercises for 1 to 2 weeks after your injury. Once your physician, physical therapist, or athletic trainer sees adequate progress, he or she will advance your exercises. While completing these exercises, remember:   Muscles can gain both the endurance and the strength needed for everyday activities through controlled exercises.  Complete these exercises as instructed by your physician, physical therapist, or athletic trainer. Progress the resistance and repetitions only as guided.  You may experience muscle soreness or fatigue, but the pain or discomfort you are trying to eliminate should never worsen during these exercises. If this pain does worsen, stop and make certain you are following the directions exactly. If the pain is still present after adjustments, discontinue the exercise until you can discuss the trouble with your clinician. STRENGTH - Dorsiflexors  Secure a rubber exercise band/tubing to a fixed object (i.e., table, pole) and loop the other end around your right / left foot.  Sit on the floor facing the fixed object. The band/tubing should be slightly tense when your foot is relaxed.  Slowly draw your foot back toward you using your ankle and toes.  Hold this position for __________ seconds. Slowly release the tension in the band and return your foot to the starting  position. Repeat __________ times. Complete this exercise __________ times per day.  STRENGTH - Plantar-flexors   Sit with your right / left leg extended. Holding onto both ends of a rubber exercise band/tubing, loop it around the ball of your foot. Keep a slight tension in the band.  Slowly push your toes away from you, pointing them downward.  Hold this position for __________ seconds. Return slowly, controlling the tension in the band/tubing. Repeat __________ times. Complete this exercise __________ times per day.  STRENGTH - Ankle Eversion  Secure one end of a rubber exercise band/tubing to a fixed object (table, pole). Loop the other end around your foot just before your toes.  Place your fists between your knees. This will focus your strengthening at your ankle.  Drawing the band/tubing across your opposite foot, slowly, pull your little toe out and up. Make sure the band/tubing is positioned to resist the entire motion.  Hold this position for __________ seconds. Have your muscles resist the band/tubing as it slowly pulls your  foot back to the starting position.  Repeat __________ times. Complete this exercise __________ times per day.  STRENGTH - Ankle Inversion  Secure one end of a rubber exercise band/tubing to a fixed object (table, pole). Loop the other end around your foot just before your toes.  Place your fists between your knees. This will focus your strengthening at your ankle.  Slowly, pull your big toe up and in, making sure the band/tubing is positioned to resist the entire motion.  Hold this position for __________ seconds.  Have your muscles resist the band/tubing as it slowly pulls your foot back to the starting position. Repeat __________ times. Complete this exercises __________ times per day.  STRENGTH - Towel Curls  Sit in a chair positioned on a non-carpeted surface.  Place your right / left foot on a towel, keeping your heel on the floor.  Pull the  towel toward your heel by only curling your toes. Keep your heel on the floor.  If instructed by your physician, physical therapist, or athletic trainer, add weight to the end of the towel. Repeat __________ times. Complete this exercise __________ times per day. Document Released: 08/22/2004 Document Revised: 06/07/2013 Document Reviewed: 05/05/2008 [facility] Patient Information 2015 [place], [place]. This information is not intended to replace advice given to you by your health care provider. Make sure you discuss any questions you have with your health care provider.

## 2013-08-26 NOTE — Progress Notes (Signed)
Subjective:  This chart was scribed for Delman Cheadle, MD by Ladene Artist, ED Scribe. The patient was seen in room 2. Patient's care was started at 10:35 AM.   Patient ID: Sarah Zuniga, female    DOB: 04/20/1955, 58 y.o.   MRN: [id number]  Chief Complaint  Patient presents with  . Ankle Injury    hurt it on Monday tripped and fell on ankle   HPI HPI Comments: Sarah Zuniga is a 58 y.o. female who presents to the [facility] complaining of fall that occurred 3 days ago. Pt states that she was at work when she tripped and twisted her L ankle inwards during fall. She reports trying to adjust to new glasses prior to the fall. Pt reports associated pain and swelling to her L ankle. She states that swelling has improved since initial onset. Pt has tried icing, elevating and applying a soft brace to her ankle with mild relief. Pt also switched to flip flops; she reports swelling with wearing flat dress shoes. Pt also reports h/o sprain to same ankle.   Past Medical History  Diagnosis Date  . Hyperlipidemia   . Hypertension   . Diplopia 04/28/2013    Monocular , left   Current Outpatient Prescriptions on File Prior to Visit  Medication Sig Dispense Refill  . cetirizine (KLS ALLER-TEC) 10 MG tablet Take 10 mg by mouth daily.        Marland Kitchen lisinopril (PRINIVIL,ZESTRIL) 20 MG tablet Take 1 tablet (20 mg total) by mouth daily.  90 tablet  3  . Multiple Vitamin (MULTIVITAMIN) tablet Take 1 tablet by mouth daily.        . Omega-3 Fatty Acids (FISH OIL) 1000 MG CAPS Take 1,000 mg by mouth daily.        . simvastatin (ZOCOR) 20 MG tablet Take 1 tablet (20 mg total) by mouth at bedtime.  30 tablet  2   No current facility-administered medications on file prior to visit.   No Known Allergies  Review of Systems  Constitutional: Negative for fever, chills and activity change.  Respiratory: Negative for shortness of breath.   Cardiovascular: Negative for chest pain and palpitations.    Gastrointestinal: Negative for abdominal pain.  Musculoskeletal: Positive for arthralgias, gait problem, joint swelling and myalgias.  Skin: Positive for color change. Negative for pallor and rash.  Neurological: Negative for dizziness, syncope and weakness.  Hematological: Negative for adenopathy. Bruises/bleeds easily.  Psychiatric/Behavioral: Negative for sleep disturbance.   Triage Vitals: BP 132/86  Pulse 69  Temp(Src) 98 F (36.7 C) (Oral)  Resp 18  Wt 171 lb (77.565 kg)  SpO2 99%    Objective:   Physical Exam  Nursing note and vitals reviewed. Constitutional: She is oriented to person, place, and time. She appears well-developed and well-nourished.  HENT:  Head: Normocephalic and atraumatic.  Eyes: Conjunctivae and EOM are normal.  Neck: Neck supple.  Cardiovascular: Normal rate.   Pulses:      Dorsalis pedis pulses are 2+ on the right side, and 2+ on the left side.  Pulmonary/Chest: Effort normal.  Musculoskeletal:       Left ankle: She exhibits decreased range of motion, swelling and ecchymosis. She exhibits no laceration and normal pulse. Tenderness. Medial malleolus, CF ligament and proximal fibula tenderness found. No lateral malleolus, no AITFL, no posterior TFL and no head of 5th metatarsal tenderness found. Achilles tendon normal.       Left foot: She exhibits decreased range of  motion, tenderness, bony tenderness and swelling. She exhibits normal capillary refill, no deformity and no laceration.  Mild pain over anterior and distal medial malleolus Positive tenderness over distal fibula  Mild tenderness over distal metatarsals on dorsal aspect   Neurological: She is alert and oriented to person, place, and time.  Skin: Skin is warm and dry.  Psychiatric: She has a normal mood and affect. Her behavior is normal.   [facility] reading (PRIMARY) by  Dr. Genelle Gather. Ankle and Left foot: No acute abnormality  Left ankle pain after an injury.  EXAM: LEFT ANKLE COMPLETE - 3+  VIEW  COMPARISON: None.  FINDINGS: There is soft tissue swelling over the lateral malleolus. A linear osseous density seen inferior to the lateral malleolus on the frontal view. Ankle mortise is intact.  IMPRESSION: Possible tiny avulsion fracture off the inferior aspect of the distal fibula with overlying soft tissue swelling.     Assessment & Plan:  Reviewed with pt. Initially in clininc advised wearing Swede-O brace x 2 weeks with decrease in weight bearing exercise thereafter. All should be healed after 6 weeks. PRN NSAIDS as needed for pain. See pt instructions.  Injury of left foot, initial encounter - Plan: DG Foot Complete Left  Left ankle pain - Plan: DG Ankle Complete Left  Sprain of ankle, unspecified site  ADDENDUM: Poss tiny avulsion fracture off distal fibula - asked pt to come into clinic to pick up air-padded splint and wear x 3 wk, no stretching/strengthening, cont RICE, ok to remove when non-weightbearing. Recheck xray in 3-4 wks and ok to resume activity when callus shows but will want to wear padded air splint or swede-o brace during that time until fully healed at 6-8 wks.  I personally performed the services described in this documentation, which was scribed in my presence. The recorded information has been reviewed and considered, and addended by me as needed.  Delman Cheadle, MD MPH

## 2013-08-27 ENCOUNTER — Telehealth: Payer: Self-pay

## 2013-08-27 NOTE — Telephone Encounter (Signed)
Spoke to pt- per Xray results pt is coming in this evening to pick up the air cast. Pt understands directions.

## 2013-08-27 NOTE — Telephone Encounter (Signed)
Pt would like to speak with someone regarding her xray results!

## 2013-09-06 ENCOUNTER — Other Ambulatory Visit: Payer: Self-pay

## 2013-09-06 DIAGNOSIS — Z1231 Encounter for screening mammogram for malignant neoplasm of breast: Secondary | ICD-10-CM

## 2013-09-21 ENCOUNTER — Ambulatory Visit
Admission: RE | Admit: 2013-09-21 | Discharge: 2013-09-21 | Disposition: A | Discharge status: Home / Self Care | Payer: 59 | Source: Ambulatory Visit

## 2013-09-21 ENCOUNTER — Ambulatory Visit (INDEPENDENT_AMBULATORY_CARE_PROVIDER_SITE_OTHER): Payer: 59 | Admitting: Family Medicine

## 2013-09-21 VITALS — BP 108/80 | HR 68 | Temp 97.6°F | Resp 16 | Ht 65.5 in | Wt 168.6 lb

## 2013-09-21 DIAGNOSIS — L237 Allergic contact dermatitis due to plants, except food: Secondary | ICD-10-CM

## 2013-09-21 DIAGNOSIS — Z1231 Encounter for screening mammogram for malignant neoplasm of breast: Secondary | ICD-10-CM

## 2013-09-21 DIAGNOSIS — L255 Unspecified contact dermatitis due to plants, except food: Secondary | ICD-10-CM

## 2013-09-21 DIAGNOSIS — R21 Rash and other nonspecific skin eruption: Secondary | ICD-10-CM

## 2013-09-21 MED ORDER — METHYLPREDNISOLONE ACETATE 80 MG/ML IJ SUSP
120.0000 mg | Freq: Once | INTRAMUSCULAR | Status: AC
  Administered 2013-09-21: 120 mg via INTRAMUSCULAR

## 2013-09-21 NOTE — Progress Notes (Signed)
Subjective: 8 days ago the patient was doing yard work and realizes that the back and she had pulled was poison ivy. She tried to wash well, but by the next day was breaking out. It is continued to persist and driving her crazy, so she came in today. She's been taking Benadryl and using topical hydrocortisone cream plus Benadryl cream or calamine lotion.  Objective: Clusters of excoriated and crusting contact dermatitis on her arms, primarily the forearms, and a little on her chest wall and legs.  Assessment Poison ivy contact dermatitis  Plan: Depo-Medrol 120 IM Triamcinolone cream Return if further concern

## 2013-09-21 NOTE — Patient Instructions (Signed)
Use the triamcinolone cream twice daily on the areas of rash. It is fine to use some Benadryl or calamine topically and needed.  Take Benadryl or Zyrtec for itching  You have been given Depo-Medrol 120. Hopefully this will get the rash drying up quickly.  Poison [employer] ivy is a inflammation of the skin (contact dermatitis) caused by touching the allergens on the leaves of the ivy plant following previous exposure to the plant. The rash usually appears 48 hours after exposure. The rash is usually bumps (papules) or blisters (vesicles) in a linear pattern. Depending on your own sensitivity, the rash may simply cause redness and itching, or it may also progress to blisters which may break open. These must be well cared for to prevent secondary bacterial (germ) infection, followed by scarring. Keep any open areas dry, clean, dressed, and covered with an antibacterial ointment if needed. The eyes may also get puffy. The puffiness is worst in the morning and gets better as the day progresses. This dermatitis usually heals without scarring, within 2 to 3 weeks without treatment. HOME CARE INSTRUCTIONS  Thoroughly wash with soap and water as soon as you have been exposed to poison ivy. You have about one half hour to remove the plant resin before it will cause the rash. This washing will destroy the oil or antigen on the skin that is causing, or will cause, the rash. Be sure to wash under your fingernails as any plant resin there will continue to spread the rash. Do not rub skin vigorously when washing affected area. Poison ivy cannot spread if no oil from the plant remains on your body. A rash that has progressed to weeping sores will not spread the rash unless you have not washed thoroughly. It is also important to wash any clothes you have been wearing as these may carry active allergens. The rash will return if you wear the unwashed clothing, even several days later. Avoidance of the plant in the future is  the best measure. Poison ivy plant can be recognized by the number of leaves. Generally, poison ivy has three leaves with flowering branches on a single stem. Diphenhydramine or zyrtec may be purchased over the counter and used as needed for itching. Do not drive with this medication if it makes you drowsy.Ask your caregiver about medication for children. SEEK MEDICAL CARE IF:  Open sores develop.  Redness spreads beyond area of rash.  You notice purulent (pus-like) discharge.  You have increased pain.  Other signs of infection develop (such as fever). Document Released: 01/19/2000 Document Revised: 04/15/2011 Document Reviewed: 07/01/2008 [facility] Patient Information 2015 [place], [place]. This information is not intended to replace advice given to you by your health care provider. Make sure you discuss any questions you have with your health care provider.

## 2013-11-19 ENCOUNTER — Other Ambulatory Visit: Payer: Self-pay

## 2014-01-07 ENCOUNTER — Ambulatory Visit (INDEPENDENT_AMBULATORY_CARE_PROVIDER_SITE_OTHER): Payer: 59 | Admitting: Family Medicine

## 2014-01-07 ENCOUNTER — Encounter: Payer: Self-pay | Admitting: Family Medicine

## 2014-01-07 VITALS — BP 124/82 | HR 68 | Temp 98.1°F | Ht 66.0 in | Wt 168.6 lb

## 2014-01-07 DIAGNOSIS — E785 Hyperlipidemia, unspecified: Secondary | ICD-10-CM

## 2014-01-07 DIAGNOSIS — Z0000 Encounter for general adult medical examination without abnormal findings: Secondary | ICD-10-CM

## 2014-01-07 DIAGNOSIS — Z23 Encounter for immunization: Secondary | ICD-10-CM

## 2014-01-07 DIAGNOSIS — I10 Essential (primary) hypertension: Secondary | ICD-10-CM

## 2014-01-07 LAB — CBC WITH DIFFERENTIAL/PLATELET
BASOS ABS: 0 10*3/uL (ref 0.0–0.1)
BASOS PCT: 0 % (ref 0–1)
EOS ABS: 0.1 10*3/uL (ref 0.0–0.7)
Eosinophils Relative: 1 % (ref 0–5)
HCT: 43.4 % (ref 36.0–46.0)
Hemoglobin: 15.8 g/dL — ABNORMAL HIGH (ref 12.0–15.0)
Lymphocytes Relative: 35 % (ref 12–46)
Lymphs Abs: 2.1 10*3/uL (ref 0.7–4.0)
MCH: 32 pg (ref 26.0–34.0)
MCHC: 36.4 g/dL — AB (ref 30.0–36.0)
MCV: 88 fL (ref 78.0–100.0)
MPV: 10.9 fL (ref 9.4–12.4)
Monocytes Absolute: 0.5 10*3/uL (ref 0.1–1.0)
Monocytes Relative: 9 % (ref 3–12)
NEUTROS ABS: 3.2 10*3/uL (ref 1.7–7.7)
NEUTROS PCT: 55 % (ref 43–77)
PLATELETS: 233 10*3/uL (ref 150–400)
RBC: 4.93 MIL/uL (ref 3.87–5.11)
RDW: 13.4 % (ref 11.5–15.5)
WBC: 5.9 10*3/uL (ref 4.0–10.5)

## 2014-01-07 LAB — BASIC METABOLIC PANEL
BUN: 16 mg/dL (ref 6–23)
CALCIUM: 10 mg/dL (ref 8.4–10.5)
CO2: 30 meq/L (ref 19–32)
CREATININE: 0.84 mg/dL (ref 0.50–1.10)
Chloride: 99 mEq/L (ref 96–112)
GLUCOSE: 85 mg/dL (ref 70–99)
Potassium: 4 mEq/L (ref 3.5–5.3)
Sodium: 138 mEq/L (ref 135–145)

## 2014-01-07 LAB — LIPID PANEL
CHOLESTEROL: 250 mg/dL — AB (ref 0–200)
HDL: 55 mg/dL (ref >39)
LDL Cholesterol: 161 mg/dL — ABNORMAL HIGH (ref 0–99)
Total CHOL/HDL Ratio: 4.5 Ratio
Triglycerides: 169 mg/dL — ABNORMAL HIGH (ref <150)
VLDL: 34 mg/dL (ref 0–40)

## 2014-01-07 LAB — POCT URINALYSIS DIPSTICK
Bilirubin, UA: NEGATIVE
GLUCOSE UA: NEGATIVE
Leukocytes, UA: NEGATIVE
Nitrite, UA: NEGATIVE
PROTEIN UA: NEGATIVE
RBC UA: NEGATIVE
SPEC GRAV UA: 1.02
UROBILINOGEN UA: 0.2
pH, UA: 6

## 2014-01-07 LAB — HEPATIC FUNCTION PANEL
ALBUMIN: 4.8 g/dL (ref 3.5–5.2)
ALT: 25 U/L (ref 0–35)
AST: 27 U/L (ref 0–37)
Alkaline Phosphatase: 49 U/L (ref 39–117)
Bilirubin, Direct: 0.1 mg/dL (ref 0.0–0.3)
Indirect Bilirubin: 0.7 mg/dL (ref 0.2–1.2)
Total Bilirubin: 0.8 mg/dL (ref 0.2–1.2)
Total Protein: 7.9 g/dL (ref 6.0–8.3)

## 2014-01-07 NOTE — Progress Notes (Signed)
Pre visit review using our clinic review tool, if applicable. No additional management support is needed unless otherwise documented below in the visit note. 

## 2014-01-07 NOTE — Progress Notes (Signed)
Subjective:     Sarah Zuniga is a 58 y.o. female and is here for a comprehensive physical exam. The patient reports no problems.  History   Social History  . Marital Status: Divorced    Spouse Name: N/A    Number of Children: 0  . Years of Education: college 3   Occupational History  . [employer] account executive   . [employer]    .     Social History Main Topics  . Smoking status: Never Smoker   . Smokeless tobacco: Never Used  . Alcohol Use: Yes     Comment: wine weekly  . Drug Use: No  . Sexual Activity:    Partners: Male   Other Topics Concern  . Not on file   Social History Narrative   Exercise-- 4x a week   Health Maintenance  Topic Date Due  . INFLUENZA VACCINE  09/05/2014  . PAP SMEAR  12/05/2014  . TETANUS/TDAP  12/14/2014  . COLONOSCOPY  12/21/2014  . MAMMOGRAM  09/22/2015    The following portions of the patient's history were reviewed and updated as appropriate:  She  has a past medical history of Hyperlipidemia; Hypertension; Diplopia (04/28/2013); Pigment dispersion syndrome of both eyes; and Cataract. She  does not have any pertinent problems on file. She  has past surgical history that includes Tonsillectomy and Wisdom tooth extraction. Her family history includes Cancer in her father; Coronary artery disease in her other; Diabetes in her mother; Hyperlipidemia in her father, mother, and sister; Hypertension in her father, mother, and sister; Stroke in her mother. She  reports that she has never smoked. She has never used smokeless tobacco. She reports that she drinks alcohol. She reports that she does not use illicit drugs. She has a current medication list which includes the following prescription(s): cetirizine, lisinopril, multivitamin, fish oil, and simvastatin. Current Outpatient Prescriptions on File Prior to Visit  Medication Sig Dispense Refill  . cetirizine (KLS ALLER-TEC) 10 MG tablet Take 10 mg by mouth daily.      Marland Kitchen lisinopril (PRINIVIL,ZESTRIL)  20 MG tablet Take 1 tablet (20 mg total) by mouth daily. 90 tablet 3  . Multiple Vitamin (MULTIVITAMIN) tablet Take 1 tablet by mouth daily.      . Omega-3 Fatty Acids (FISH OIL) 1000 MG CAPS Take 1,000 mg by mouth daily.      . simvastatin (ZOCOR) 20 MG tablet Take 1 tablet (20 mg total) by mouth at bedtime. 30 tablet 2   No current facility-administered medications on file prior to visit.   She has No Known Allergies..  Review of Systems Review of Systems  Constitutional: Negative for activity change, appetite change and fatigue.  HENT: Negative for hearing loss, congestion, tinnitus and ear discharge.  dentist q34m Eyes: Negative for visual disturbance (see optho q1y ).  Respiratory: Negative for cough, chest tightness and shortness of breath.   Cardiovascular: Negative for chest pain, palpitations and leg swelling.  Gastrointestinal: Negative for abdominal pain, diarrhea, constipation and abdominal distention.  Genitourinary: Negative for urgency, frequency, decreased urine volume and difficulty urinating.  Musculoskeletal: Negative for back pain, arthralgias and gait problem.  Skin: Negative for color change, pallor and rash.  Neurological: Negative for dizziness, light-headedness, numbness and headaches.  Hematological: Negative for adenopathy. Does not bruise/bleed easily.  Psychiatric/Behavioral: Negative for suicidal ideas, confusion, sleep disturbance, self-injury, dysphoric mood, decreased concentration and agitation.       Objective:    BP 124/82 mmHg  Pulse 68  Temp(Src) 98.1 F (36.7 C) (Oral)  Ht 5\' 6"  (1.676 m)  Wt 168 lb 9.6 oz (76.476 kg)  BMI 27.23 kg/m2  SpO2 95% General appearance: alert, cooperative, appears stated age and no distress Head: Normocephalic, without obvious abnormality, atraumatic Eyes: positive findings: cataract L eye, Perla, eomi Ears: normal TM's and external ear canals both ears Nose: Nares normal. Septum midline. Mucosa normal. No  drainage or sinus tenderness. Throat: lips, mucosa, and tongue normal; teeth and gums normal Neck: no adenopathy, no carotid bruit, no JVD, supple, symmetrical, trachea midline and thyroid not enlarged, symmetric, no tenderness/mass/nodules Back: symmetric, no curvature. ROM normal. No CVA tenderness. Lungs: clear to auscultation bilaterally Breasts: normal appearance, no masses or tenderness Heart: regular rate and rhythm, S1, S2 normal, no murmur, click, rub or gallop Abdomen: soft, non-tender; bowel sounds normal; no masses,  no organomegaly Pelvic: deferred Extremities: extremities normal, atraumatic, no cyanosis or edema Pulses: 2+ and symmetric Skin: Skin color, texture, turgor normal. No rashes or lesions Lymph nodes: Cervical, supraclavicular, and axillary nodes normal. Neurologic: Alert and oriented X 3, normal strength and tone. Normal symmetric reflexes. Normal coordination and gait Psych-- no anxiety, no depression      Assessment:    Healthy female exam.      Plan:    ghm utd Check labs See After Visit Summary for Counseling Recommendations   1. Need for prophylactic vaccination and inoculation against influenza   - Flu Vaccine QUAD 36+ mos PF IM (Fluarix Quad PF) - Basic Metabolic Panel (BMET) - CBC w/Diff  2. Preventative health care  - Hepatic function panel - Lipid panel - Microalbumin / creatinine urine ratio - POCT urinalysis dipstick - TSH - Basic Metabolic Panel (BMET) - CBC w/Diff  3. Hyperlipidemia Check labs - Hepatic function panel - Lipid panel - Basic Metabolic Panel (BMET) - CBC w/Diff  4. Essential hypertension   - Microalbumin / creatinine urine ratio - POCT urinalysis dipstick - Basic Metabolic Panel (BMET) - CBC w/Diff

## 2014-01-07 NOTE — Patient Instructions (Signed)
Preventive Care for Adults A healthy lifestyle and preventive care can promote health and wellness. Preventive health guidelines for women include the following key practices.  A routine yearly physical is a good way to check with your health care provider about your health and preventive screening. It is a chance to share any concerns and updates on your health and to receive a thorough exam.  Visit your dentist for a routine exam and preventive care every 6 months. Brush your teeth twice a day and floss once a day. Good oral hygiene prevents tooth decay and gum disease.  The frequency of eye exams is based on your age, health, family medical history, use of contact lenses, and other factors. Follow your health care provider's recommendations for frequency of eye exams.  Eat a healthy diet. Foods like vegetables, fruits, whole grains, low-fat dairy products, and lean protein foods contain the nutrients you need without too many calories. Decrease your intake of foods high in solid fats, added sugars, and salt. Eat the right amount of calories for you.Get information about a proper diet from your health care provider, if necessary.  Regular physical exercise is one of the most important things you can do for your health. Most adults should get at least 150 minutes of moderate-intensity exercise (any activity that increases your heart rate and causes you to sweat) each week. In addition, most adults need muscle-strengthening exercises on 2 or more days a week.  Maintain a healthy weight. The body mass index (BMI) is a screening tool to identify possible weight problems. It provides an estimate of body fat based on height and weight. Your health care provider can find your BMI and can help you achieve or maintain a healthy weight.For adults 20 years and older:  A BMI below 18.5 is considered underweight.  A BMI of 18.5 to 24.9 is normal.  A BMI of 25 to 29.9 is considered overweight.  A BMI of  30 and above is considered obese.  Maintain normal blood lipids and cholesterol levels by exercising and minimizing your intake of saturated fat. Eat a balanced diet with plenty of fruit and vegetables. Blood tests for lipids and cholesterol should begin at age 76 and be repeated every 5 years. If your lipid or cholesterol levels are high, you are over 50, or you are at high risk for heart disease, you may need your cholesterol levels checked more frequently.Ongoing high lipid and cholesterol levels should be treated with medicines if diet and exercise are not working.  If you smoke, find out from your health care provider how to quit. If you do not use tobacco, do not start.  Lung cancer screening is recommended for adults aged 22-80 years who are at high risk for developing lung cancer because of a history of smoking. A yearly low-dose CT scan of the lungs is recommended for people who have at least a 30-pack-year history of smoking and are a current smoker or have quit within the past 15 years. A pack year of smoking is smoking an average of 1 pack of cigarettes a day for 1 year (for example: 1 pack a day for 30 years or 2 packs a day for 15 years). Yearly screening should continue until the smoker has stopped smoking for at least 15 years. Yearly screening should be stopped for people who develop a health problem that would prevent them from having lung cancer treatment.  If you are pregnant, do not drink alcohol. If you are breastfeeding,  be very cautious about drinking alcohol. If you are not pregnant and choose to drink alcohol, do not have more than 1 drink per day. One drink is considered to be 12 ounces (355 mL) of beer, 5 ounces (148 mL) of wine, or 1.5 ounces (44 mL) of liquor.  Avoid use of street drugs. Do not share needles with anyone. Ask for help if you need support or instructions about stopping the use of drugs.  High blood pressure causes heart disease and increases the risk of  stroke. Your blood pressure should be checked at least every 1 to 2 years. Ongoing high blood pressure should be treated with medicines if weight loss and exercise do not work.  If you are 75-52 years old, ask your health care provider if you should take aspirin to prevent strokes.  Diabetes screening involves taking a blood sample to check your fasting blood sugar level. This should be done once every 3 years, after age 15, if you are within normal weight and without risk factors for diabetes. Testing should be considered at a younger age or be carried out more frequently if you are overweight and have at least 1 risk factor for diabetes.  Breast cancer screening is essential preventive care for women. You should practice "breast self-awareness." This means understanding the normal appearance and feel of your breasts and may include breast self-examination. Any changes detected, no matter how small, should be reported to a health care provider. Women in their 58s and 30s should have a clinical breast exam (CBE) by a health care provider as part of a regular health exam every 1 to 3 years. After age 16, women should have a CBE every year. Starting at age 53, women should consider having a mammogram (breast X-ray test) every year. Women who have a family history of breast cancer should talk to their health care provider about genetic screening. Women at a high risk of breast cancer should talk to their health care providers about having an MRI and a mammogram every year.  Breast cancer gene (BRCA)-related cancer risk assessment is recommended for women who have family members with BRCA-related cancers. BRCA-related cancers include breast, ovarian, tubal, and peritoneal cancers. Having family members with these cancers may be associated with an increased risk for harmful changes (mutations) in the breast cancer genes BRCA1 and BRCA2. Results of the assessment will determine the need for genetic counseling and  BRCA1 and BRCA2 testing.  Routine pelvic exams to screen for cancer are no longer recommended for nonpregnant women who are considered low risk for cancer of the pelvic organs (ovaries, uterus, and vagina) and who do not have symptoms. Ask your health care provider if a screening pelvic exam is right for you.  If you have had past treatment for cervical cancer or a condition that could lead to cancer, you need Pap tests and screening for cancer for at least 20 years after your treatment. If Pap tests have been discontinued, your risk factors (such as having a new sexual partner) need to be reassessed to determine if screening should be resumed. Some women have medical problems that increase the chance of getting cervical cancer. In these cases, your health care provider may recommend more frequent screening and Pap tests.  The HPV test is an additional test that may be used for cervical cancer screening. The HPV test looks for the virus that can cause the cell changes on the cervix. The cells collected during the Pap test can be  tested for HPV. The HPV test could be used to screen women aged 30 years and older, and should be used in women of any age who have unclear Pap test results. After the age of 30, women should have HPV testing at the same frequency as a Pap test.  Colorectal cancer can be detected and often prevented. Most routine colorectal cancer screening begins at the age of 50 years and continues through age 75 years. However, your health care provider may recommend screening at an earlier age if you have risk factors for colon cancer. On a yearly basis, your health care provider may provide home test kits to check for hidden blood in the stool. Use of a small camera at the end of a tube, to directly examine the colon (sigmoidoscopy or colonoscopy), can detect the earliest forms of colorectal cancer. Talk to your health care provider about this at age 50, when routine screening begins. Direct  exam of the colon should be repeated every 5-10 years through age 75 years, unless early forms of pre-cancerous polyps or small growths are found.  People who are at an increased risk for hepatitis B should be screened for this virus. You are considered at high risk for hepatitis B if:  You were born in a country where hepatitis B occurs often. Talk with your health care provider about which countries are considered high risk.  Your parents were born in a high-risk country and you have not received a shot to protect against hepatitis B (hepatitis B vaccine).  You have HIV or AIDS.  You use needles to inject street drugs.  You live with, or have sex with, someone who has hepatitis B.  You get hemodialysis treatment.  You take certain medicines for conditions like cancer, organ transplantation, and autoimmune conditions.  Hepatitis C blood testing is recommended for all people born from 1945 through 1965 and any individual with known risks for hepatitis C.  Practice safe sex. Use condoms and avoid high-risk sexual practices to reduce the spread of sexually transmitted infections (STIs). STIs include gonorrhea, chlamydia, syphilis, trichomonas, herpes, HPV, and human immunodeficiency virus (HIV). Herpes, HIV, and HPV are viral illnesses that have no cure. They can result in disability, cancer, and death.  You should be screened for sexually transmitted illnesses (STIs) including gonorrhea and chlamydia if:  You are sexually active and are younger than 24 years.  You are older than 24 years and your health care provider tells you that you are at risk for this type of infection.  Your sexual activity has changed since you were last screened and you are at an increased risk for chlamydia or gonorrhea. Ask your health care provider if you are at risk.  If you are at risk of being infected with HIV, it is recommended that you take a prescription medicine daily to prevent HIV infection. This is  called preexposure prophylaxis (PrEP). You are considered at risk if:  You are a heterosexual woman, are sexually active, and are at increased risk for HIV infection.  You take drugs by injection.  You are sexually active with a partner who has HIV.  Talk with your health care provider about whether you are at high risk of being infected with HIV. If you choose to begin PrEP, you should first be tested for HIV. You should then be tested every 3 months for as long as you are taking PrEP.  Osteoporosis is a disease in which the bones lose minerals and strength   with aging. This can result in serious bone fractures or breaks. The risk of osteoporosis can be identified using a bone density scan. Women ages 65 years and over and women at risk for fractures or osteoporosis should discuss screening with their health care providers. Ask your health care provider whether you should take a calcium supplement or vitamin D to reduce the rate of osteoporosis.  Menopause can be associated with physical symptoms and risks. Hormone replacement therapy is available to decrease symptoms and risks. You should talk to your health care provider about whether hormone replacement therapy is right for you.  Use sunscreen. Apply sunscreen liberally and repeatedly throughout the day. You should seek shade when your shadow is shorter than you. Protect yourself by wearing long sleeves, pants, a wide-brimmed hat, and sunglasses year round, whenever you are outdoors.  Once a month, do a whole body skin exam, using a mirror to look at the skin on your back. Tell your health care provider of new moles, moles that have irregular borders, moles that are larger than a pencil eraser, or moles that have changed in shape or color.  Stay current with required vaccines (immunizations).  Influenza vaccine. All adults should be immunized every year.  Tetanus, diphtheria, and acellular pertussis (Td, Tdap) vaccine. Pregnant women should  receive 1 dose of Tdap vaccine during each pregnancy. The dose should be obtained regardless of the length of time since the last dose. Immunization is preferred during the 27th-36th week of gestation. An adult who has not previously received Tdap or who does not know her vaccine status should receive 1 dose of Tdap. This initial dose should be followed by tetanus and diphtheria toxoids (Td) booster doses every 10 years. Adults with an unknown or incomplete history of completing a 3-dose immunization series with Td-containing vaccines should begin or complete a primary immunization series including a Tdap dose. Adults should receive a Td booster every 10 years.  Varicella vaccine. An adult without evidence of immunity to varicella should receive 2 doses or a second dose if she has previously received 1 dose. Pregnant females who do not have evidence of immunity should receive the first dose after pregnancy. This first dose should be obtained before leaving the health care facility. The second dose should be obtained 4-8 weeks after the first dose.  Human papillomavirus (HPV) vaccine. Females aged 13-26 years who have not received the vaccine previously should obtain the 3-dose series. The vaccine is not recommended for use in pregnant females. However, pregnancy testing is not needed before receiving a dose. If a female is found to be pregnant after receiving a dose, no treatment is needed. In that case, the remaining doses should be delayed until after the pregnancy. Immunization is recommended for any person with an immunocompromised condition through the age of 26 years if she did not get any or all doses earlier. During the 3-dose series, the second dose should be obtained 4-8 weeks after the first dose. The third dose should be obtained 24 weeks after the first dose and 16 weeks after the second dose.  Zoster vaccine. One dose is recommended for adults aged 60 years or older unless certain conditions are  present.  Measles, mumps, and rubella (MMR) vaccine. Adults born before 1957 generally are considered immune to measles and mumps. Adults born in 1957 or later should have 1 or more doses of MMR vaccine unless there is a contraindication to the vaccine or there is laboratory evidence of immunity to   each of the three diseases. A routine second dose of MMR vaccine should be obtained at least 28 days after the first dose for students attending postsecondary schools, health care workers, or international travelers. People who received inactivated measles vaccine or an unknown type of measles vaccine during 1963-1967 should receive 2 doses of MMR vaccine. People who received inactivated mumps vaccine or an unknown type of mumps vaccine before 1979 and are at high risk for mumps infection should consider immunization with 2 doses of MMR vaccine. For females of childbearing age, rubella immunity should be determined. If there is no evidence of immunity, females who are not pregnant should be vaccinated. If there is no evidence of immunity, females who are pregnant should delay immunization until after pregnancy. Unvaccinated health care workers born before 1957 who lack laboratory evidence of measles, mumps, or rubella immunity or laboratory confirmation of disease should consider measles and mumps immunization with 2 doses of MMR vaccine or rubella immunization with 1 dose of MMR vaccine.  Pneumococcal 13-valent conjugate (PCV13) vaccine. When indicated, a person who is uncertain of her immunization history and has no record of immunization should receive the PCV13 vaccine. An adult aged 19 years or older who has certain medical conditions and has not been previously immunized should receive 1 dose of PCV13 vaccine. This PCV13 should be followed with a dose of pneumococcal polysaccharide (PPSV23) vaccine. The PPSV23 vaccine dose should be obtained at least 8 weeks after the dose of PCV13 vaccine. An adult aged 19  years or older who has certain medical conditions and previously received 1 or more doses of PPSV23 vaccine should receive 1 dose of PCV13. The PCV13 vaccine dose should be obtained 1 or more years after the last PPSV23 vaccine dose.  Pneumococcal polysaccharide (PPSV23) vaccine. When PCV13 is also indicated, PCV13 should be obtained first. All adults aged 65 years and older should be immunized. An adult younger than age 65 years who has certain medical conditions should be immunized. Any person who resides in a nursing home or long-term care facility should be immunized. An adult smoker should be immunized. People with an immunocompromised condition and certain other conditions should receive both PCV13 and PPSV23 vaccines. People with human immunodeficiency virus (HIV) infection should be immunized as soon as possible after diagnosis. Immunization during chemotherapy or radiation therapy should be avoided. Routine use of PPSV23 vaccine is not recommended for American Indians, [place] Natives, or people younger than 65 years unless there are medical conditions that require PPSV23 vaccine. When indicated, people who have unknown immunization and have no record of immunization should receive PPSV23 vaccine. One-time revaccination 5 years after the first dose of PPSV23 is recommended for people aged 19-64 years who have chronic kidney failure, nephrotic syndrome, asplenia, or immunocompromised conditions. People who received 1-2 doses of PPSV23 before age 65 years should receive another dose of PPSV23 vaccine at age 65 years or later if at least 5 years have passed since the previous dose. Doses of PPSV23 are not needed for people immunized with PPSV23 at or after age 65 years.  Meningococcal vaccine. Adults with asplenia or persistent complement component deficiencies should receive 2 doses of quadrivalent meningococcal conjugate (MenACWY-D) vaccine. The doses should be obtained at least 2 months apart.  Microbiologists working with certain meningococcal bacteria, military recruits, people at risk during an outbreak, and people who travel to or live in countries with a high rate of meningitis should be immunized. A first-year college student up through age   21 years who is living in a residence hall should receive a dose if she did not receive a dose on or after her 16th birthday. Adults who have certain high-risk conditions should receive one or more doses of vaccine.  Hepatitis A vaccine. Adults who wish to be protected from this disease, have certain high-risk conditions, work with hepatitis A-infected animals, work in hepatitis A research labs, or travel to or work in countries with a high rate of hepatitis A should be immunized. Adults who were previously unvaccinated and who anticipate close contact with an international adoptee during the first 60 days after arrival in the [place] from a country with a high rate of hepatitis A should be immunized.  Hepatitis B vaccine. Adults who wish to be protected from this disease, have certain high-risk conditions, may be exposed to blood or other infectious body fluids, are household contacts or sex partners of hepatitis B positive people, are clients or workers in certain care facilities, or travel to or work in countries with a high rate of hepatitis B should be immunized.  Haemophilus influenzae type b (Hib) vaccine. A previously unvaccinated person with asplenia or sickle cell disease or having a scheduled splenectomy should receive 1 dose of Hib vaccine. Regardless of previous immunization, a recipient of a hematopoietic stem cell transplant should receive a 3-dose series 6-12 months after her successful transplant. Hib vaccine is not recommended for adults with HIV infection. Preventive Services / Frequency Ages 64 to 68 years  Blood pressure check.** / Every 1 to 2 years.  Lipid and cholesterol check.** / Every 5 years beginning at age  22.  Clinical breast exam.** / Every 3 years for women in their 88s and 53s.  BRCA-related cancer risk assessment.** / For women who have family members with a BRCA-related cancer (breast, ovarian, tubal, or peritoneal cancers).  Pap test.** / Every 2 years from ages 90 through 51. Every 3 years starting at age 21 through age 56 or 3 with a history of 3 consecutive normal Pap tests.  HPV screening.** / Every 3 years from ages 24 through ages 1 to 46 with a history of 3 consecutive normal Pap tests.  Hepatitis C blood test.** / For any individual with known risks for hepatitis C.  Skin self-exam. / Monthly.  Influenza vaccine. / Every year.  Tetanus, diphtheria, and acellular pertussis (Tdap, Td) vaccine.** / Consult your health care provider. Pregnant women should receive 1 dose of Tdap vaccine during each pregnancy. 1 dose of Td every 10 years.  Varicella vaccine.** / Consult your health care provider. Pregnant females who do not have evidence of immunity should receive the first dose after pregnancy.  HPV vaccine. / 3 doses over 6 months, if 72 and younger. The vaccine is not recommended for use in pregnant females. However, pregnancy testing is not needed before receiving a dose.  Measles, mumps, rubella (MMR) vaccine.** / You need at least 1 dose of MMR if you were born in 1957 or later. You may also need a 2nd dose. For females of childbearing age, rubella immunity should be determined. If there is no evidence of immunity, females who are not pregnant should be vaccinated. If there is no evidence of immunity, females who are pregnant should delay immunization until after pregnancy.  Pneumococcal 13-valent conjugate (PCV13) vaccine.** / Consult your health care provider.  Pneumococcal polysaccharide (PPSV23) vaccine.** / 1 to 2 doses if you smoke cigarettes or if you have certain conditions.  Meningococcal vaccine.** /  1 dose if you are age 19 to 21 years and a first-year college  student living in a residence hall, or have one of several medical conditions, you need to get vaccinated against meningococcal disease. You may also need additional booster doses.  Hepatitis A vaccine.** / Consult your health care provider.  Hepatitis B vaccine.** / Consult your health care provider.  Haemophilus influenzae type b (Hib) vaccine.** / Consult your health care provider. Ages 40 to 64 years  Blood pressure check.** / Every 1 to 2 years.  Lipid and cholesterol check.** / Every 5 years beginning at age 20 years.  Lung cancer screening. / Every year if you are aged 55-80 years and have a 30-pack-year history of smoking and currently smoke or have quit within the past 15 years. Yearly screening is stopped once you have quit smoking for at least 15 years or develop a health problem that would prevent you from having lung cancer treatment.  Clinical breast exam.** / Every year after age 40 years.  BRCA-related cancer risk assessment.** / For women who have family members with a BRCA-related cancer (breast, ovarian, tubal, or peritoneal cancers).  Mammogram.** / Every year beginning at age 40 years and continuing for as long as you are in good health. Consult with your health care provider.  Pap test.** / Every 3 years starting at age 30 years through age 65 or 70 years with a history of 3 consecutive normal Pap tests.  HPV screening.** / Every 3 years from ages 30 years through ages 65 to 70 years with a history of 3 consecutive normal Pap tests.  Fecal occult blood test (FOBT) of stool. / Every year beginning at age 50 years and continuing until age 75 years. You may not need to do this test if you get a colonoscopy every 10 years.  Flexible sigmoidoscopy or colonoscopy.** / Every 5 years for a flexible sigmoidoscopy or every 10 years for a colonoscopy beginning at age 50 years and continuing until age 75 years.  Hepatitis C blood test.** / For all people born from 1945 through  1965 and any individual with known risks for hepatitis C.  Skin self-exam. / Monthly.  Influenza vaccine. / Every year.  Tetanus, diphtheria, and acellular pertussis (Tdap/Td) vaccine.** / Consult your health care provider. Pregnant women should receive 1 dose of Tdap vaccine during each pregnancy. 1 dose of Td every 10 years.  Varicella vaccine.** / Consult your health care provider. Pregnant females who do not have evidence of immunity should receive the first dose after pregnancy.  Zoster vaccine.** / 1 dose for adults aged 60 years or older.  Measles, mumps, rubella (MMR) vaccine.** / You need at least 1 dose of MMR if you were born in 1957 or later. You may also need a 2nd dose. For females of childbearing age, rubella immunity should be determined. If there is no evidence of immunity, females who are not pregnant should be vaccinated. If there is no evidence of immunity, females who are pregnant should delay immunization until after pregnancy.  Pneumococcal 13-valent conjugate (PCV13) vaccine.** / Consult your health care provider.  Pneumococcal polysaccharide (PPSV23) vaccine.** / 1 to 2 doses if you smoke cigarettes or if you have certain conditions.  Meningococcal vaccine.** / Consult your health care provider.  Hepatitis A vaccine.** / Consult your health care provider.  Hepatitis B vaccine.** / Consult your health care provider.  Haemophilus influenzae type b (Hib) vaccine.** / Consult your health care provider. Ages 65   years and over  Blood pressure check.** / Every 1 to 2 years.  Lipid and cholesterol check.** / Every 5 years beginning at age 22 years.  Lung cancer screening. / Every year if you are aged 73-80 years and have a 30-pack-year history of smoking and currently smoke or have quit within the past 15 years. Yearly screening is stopped once you have quit smoking for at least 15 years or develop a health problem that would prevent you from having lung cancer  treatment.  Clinical breast exam.** / Every year after age 4 years.  BRCA-related cancer risk assessment.** / For women who have family members with a BRCA-related cancer (breast, ovarian, tubal, or peritoneal cancers).  Mammogram.** / Every year beginning at age 40 years and continuing for as long as you are in good health. Consult with your health care provider.  Pap test.** / Every 3 years starting at age 9 years through age 34 or 91 years with 3 consecutive normal Pap tests. Testing can be stopped between 65 and 70 years with 3 consecutive normal Pap tests and no abnormal Pap or HPV tests in the past 10 years.  HPV screening.** / Every 3 years from ages 57 years through ages 64 or 45 years with a history of 3 consecutive normal Pap tests. Testing can be stopped between 65 and 70 years with 3 consecutive normal Pap tests and no abnormal Pap or HPV tests in the past 10 years.  Fecal occult blood test (FOBT) of stool. / Every year beginning at age 15 years and continuing until age 17 years. You may not need to do this test if you get a colonoscopy every 10 years.  Flexible sigmoidoscopy or colonoscopy.** / Every 5 years for a flexible sigmoidoscopy or every 10 years for a colonoscopy beginning at age 86 years and continuing until age 71 years.  Hepatitis C blood test.** / For all people born from [birth year] through 1965 and any individual with known risks for hepatitis C.  Osteoporosis screening.** / A one-time screening for women ages 83 years and over and women at risk for fractures or osteoporosis.  Skin self-exam. / Monthly.  Influenza vaccine. / Every year.  Tetanus, diphtheria, and acellular pertussis (Tdap/Td) vaccine.** / 1 dose of Td every 10 years.  Varicella vaccine.** / Consult your health care provider.  Zoster vaccine.** / 1 dose for adults aged 61 years or older.  Pneumococcal 13-valent conjugate (PCV13) vaccine.** / Consult your health care provider.  Pneumococcal  polysaccharide (PPSV23) vaccine.** / 1 dose for all adults aged 28 years and older.  Meningococcal vaccine.** / Consult your health care provider.  Hepatitis A vaccine.** / Consult your health care provider.  Hepatitis B vaccine.** / Consult your health care provider.  Haemophilus influenzae type b (Hib) vaccine.** / Consult your health care provider. ** Family history and personal history of risk and conditions may change your health care provider's recommendations. Document Released: 03/19/2001 Document Revised: 06/07/2013 Document Reviewed: 06/18/2010 [facility] Patient Information 2015 [place], [place]. This information is not intended to replace advice given to you by your health care provider. Make sure you discuss any questions you have with your health care provider.

## 2014-01-08 LAB — MICROALBUMIN / CREATININE URINE RATIO
CREATININE, URINE: 63.7 mg/dL
MICROALB/CREAT RATIO: 4.7 mg/g (ref 0.0–30.0)
Microalb, Ur: 0.3 mg/dL (ref <2.0)

## 2014-01-08 LAB — TSH: TSH: 0.877 u[IU]/mL (ref 0.350–4.500)

## 2014-01-10 ENCOUNTER — Other Ambulatory Visit: Payer: Self-pay

## 2014-01-10 MED ORDER — SIMVASTATIN 40 MG PO TABS: 40.0000 mg | ORAL_TABLET | Freq: Every day | ORAL | Status: DC

## 2014-08-02 ENCOUNTER — Other Ambulatory Visit: Payer: Self-pay | Admitting: Family Medicine

## 2014-08-17 ENCOUNTER — Other Ambulatory Visit: Payer: Self-pay | Admitting: Family Medicine

## 2014-08-17 DIAGNOSIS — Z1231 Encounter for screening mammogram for malignant neoplasm of breast: Secondary | ICD-10-CM

## 2014-09-19 ENCOUNTER — Ambulatory Visit (INDEPENDENT_AMBULATORY_CARE_PROVIDER_SITE_OTHER): Payer: 59 | Admitting: Physician Assistant

## 2014-09-19 VITALS — BP 104/80 | HR 68 | Temp 97.7°F | Resp 16 | Ht 65.5 in | Wt 172.1 lb

## 2014-09-19 DIAGNOSIS — J069 Acute upper respiratory infection, unspecified: Secondary | ICD-10-CM | POA: Diagnosis not present

## 2014-09-19 DIAGNOSIS — B9789 Other viral agents as the cause of diseases classified elsewhere: Principal | ICD-10-CM

## 2014-09-19 MED ORDER — LORATADINE-PSEUDOEPHEDRINE ER 5-120 MG PO TB12: 1.0000 | ORAL_TABLET | Freq: Every day | ORAL | Status: DC

## 2014-09-19 MED ORDER — NAPROXEN 500 MG PO TABS: 500.0000 mg | ORAL_TABLET | Freq: Two times a day (BID) | ORAL | Status: DC

## 2014-09-19 MED ORDER — HYDROCODONE-HOMATROPINE 5-1.5 MG/5ML PO SYRP: 5.0000 mL | ORAL_SOLUTION | Freq: Three times a day (TID) | ORAL | Status: DC | PRN

## 2014-09-19 NOTE — Progress Notes (Signed)
09/19/2014 at 6:49 PM  Sarah Zuniga / DOB: 02/02/1956 / MRN: [id number]  The patient has SHINGLES; HYPERLIPIDEMIA; HYPERTENSION; WISDOM TEETH EXTRACTION, HX OF; and Diplopia on her problem list.  SUBJECTIVE  Sarah Zuniga is a 59 y.o. female complaining of nasal blockage, post nasal drip, sinus and nasal congestion and sore throat that started 5 days ago.  Associated symptoms include cough today, and she denies fever, difficulty breathing, headache and jaw pain.The patient symptoms are improving. Treatments tried thus far include ibuprofen, antihistamine-decongestant of choice with fair  relief. She reports sick contacts.    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    Medications reviewed and updated by myself where necessary, and exist elsewhere in the encounter.   Ms. Delancey has No Known Allergies. She  reports that she has never smoked. She has never used smokeless tobacco. She reports that she drinks alcohol. She reports that she does not use illicit drugs. She  reports that she does not currently engage in sexual activity but has had female partners. The patient  has past surgical history that includes Tonsillectomy and Wisdom tooth extraction.  Her family history includes Cancer in her father; Coronary artery disease in her other; Diabetes in her mother; Hyperlipidemia in her father, mother, and sister; Hypertension in her father, mother, and sister; Stroke in her mother.  Review of Systems  Constitutional: Negative for fever, chills, weight loss and diaphoresis.  HENT: Positive for congestion. Negative for ear pain, hearing loss and sore throat.   Eyes: Negative for discharge and redness.  Respiratory: Negative for cough, shortness of breath and wheezing.   Cardiovascular: Negative.   Gastrointestinal: Negative.   Genitourinary: Negative.   Musculoskeletal: Negative.   Skin:  Negative for itching and rash.  Neurological: Negative for dizziness and headaches.    OBJECTIVE  Her  height is 5' 5.5" (1.664 m) and weight is 172 lb 2 oz (78.075 kg). Her oral temperature is 97.7 F (36.5 C). Her blood pressure is 104/80 and her pulse is 68. Her respiration is 16 and oxygen saturation is 98%.  The patient's body mass index is 28.2 kg/(m^2).  Physical Exam  Constitutional: She is oriented to person, place, and time. She appears well-developed and well-nourished. No distress.  HENT:  Right Ear: Hearing, tympanic membrane, external ear and ear canal normal.  Left Ear: Hearing, tympanic membrane, external ear and ear canal normal.  Nose: Mucosal edema present. Right sinus exhibits no maxillary sinus tenderness and no frontal sinus tenderness. Left sinus exhibits no maxillary sinus tenderness and no frontal sinus tenderness.  Mouth/Throat: Uvula is midline, oropharynx is clear and moist and mucous membranes are normal.  Cardiovascular: Normal rate, regular rhythm and normal heart sounds.   Respiratory: Effort normal and breath sounds normal. She has no wheezes. She has no rales.  Neurological: She is alert and oriented to person, place, and time.  Skin: Skin is warm and dry. She is not diaphoretic.  Psychiatric: She has a normal mood and affect.    No results found for this or any previous visit (from the past 24 hour(s)).  ASSESSMENT & PLAN  [name] was seen today for sore throat, sinus drainage and nasal congestion.  Diagnoses and all orders for this visit:  Viral URI with cough -     loratadine-pseudoephedrine (CLARITIN-D 12 HOUR) 5-120 MG per tablet; Take 1 tablet by mouth daily with breakfast. -  naproxen (NAPROSYN) 500 MG tablet; Take 1 tablet (500 mg total) by mouth 2 (two) times daily with a meal. -     HYDROcodone-homatropine (HYCODAN) 5-1.5 MG/5ML syrup; Take 5 mLs by mouth every 8 (eight) hours as needed for cough.    The patient was advised to call or  come back to clinic if she does not see an improvement in symptoms, or worsens with the above plan.   Philis Fendt, MHS, PA-C [facility] 09/19/2014 6:49 PM

## 2014-09-23 ENCOUNTER — Ambulatory Visit
Admission: RE | Admit: 2014-09-23 | Discharge: 2014-09-23 | Disposition: A | Discharge status: Home / Self Care | Payer: Commercial Managed Care - HMO | Source: Ambulatory Visit | Attending: Family Medicine | Admitting: Family Medicine

## 2014-09-23 DIAGNOSIS — Z1231 Encounter for screening mammogram for malignant neoplasm of breast: Secondary | ICD-10-CM

## 2014-10-25 ENCOUNTER — Ambulatory Visit (INDEPENDENT_AMBULATORY_CARE_PROVIDER_SITE_OTHER): Payer: 59 | Admitting: Physician Assistant

## 2014-10-25 VITALS — BP 130/80 | HR 74 | Temp 97.4°F | Resp 18 | Ht 65.5 in | Wt 173.0 lb

## 2014-10-25 DIAGNOSIS — J22 Unspecified acute lower respiratory infection: Secondary | ICD-10-CM

## 2014-10-25 DIAGNOSIS — J069 Acute upper respiratory infection, unspecified: Secondary | ICD-10-CM

## 2014-10-25 DIAGNOSIS — J988 Other specified respiratory disorders: Secondary | ICD-10-CM | POA: Diagnosis not present

## 2014-10-25 DIAGNOSIS — B9789 Other viral agents as the cause of diseases classified elsewhere: Principal | ICD-10-CM

## 2014-10-25 MED ORDER — AZITHROMYCIN 250 MG PO TABS: ORAL_TABLET | ORAL | Status: DC

## 2014-10-25 MED ORDER — NAPROXEN 500 MG PO TABS: 500.0000 mg | ORAL_TABLET | Freq: Two times a day (BID) | ORAL | Status: DC

## 2014-10-25 MED ORDER — LORATADINE-PSEUDOEPHEDRINE ER 5-120 MG PO TB12: 1.0000 | ORAL_TABLET | Freq: Every day | ORAL | Status: DC

## 2014-10-25 NOTE — Progress Notes (Signed)
10/26/2014 at 12:03 AM  Sarah Zuniga / DOB: [birth year] / MRN: [id number]  The patient has SHINGLES; HYPERLIPIDEMIA; HYPERTENSION; WISDOM TEETH EXTRACTION, HX OF; and Diplopia on her problem list.  SUBJECTIVE  Sarah Zuniga is a 59 y.o. female complaining of productive cough and sinus and nasal congestion that started 4 days ago.  Associated symptoms include runny nose and sore throat today, and she denies fever, difficulty breathing, headache and jaw pain.The patient symptoms show no change. Treatments tried thus far include cough suppressant of choice with poor relief. She denies sick contacts.  She reports she was treated for similar symptoms roughly one month ago and has had a lingering cough since that time, however the above mentioned symptoms are new.      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    Medications reviewed and updated by myself where necessary, and exist elsewhere in the encounter.   Sarah Zuniga has No Known Allergies. She  reports that she has never smoked. She has never used smokeless tobacco. She reports that she drinks alcohol. She reports that she does not use illicit drugs. She  reports that she does not currently engage in sexual activity but has had female partners. The patient  has past surgical history that includes Tonsillectomy and Wisdom tooth extraction.  Her family history includes Cancer in her father; Coronary artery disease in her other; Diabetes in her mother; Hyperlipidemia in her father, mother, and sister; Hypertension in her father, mother, and sister; Stroke in her mother.  Review of Systems  Constitutional: Negative for fever and chills.  Respiratory: Negative for shortness of breath.   Cardiovascular: Negative for chest pain.  Gastrointestinal: Negative for nausea and abdominal pain.  Genitourinary: Negative.   Skin: Negative for rash.    Neurological: Negative for dizziness and headaches.    OBJECTIVE  Her  height is 5' 5.5" (1.664 m) and weight is 173 lb (78.472 kg). Her oral temperature is 97.4 F (36.3 C). Her blood pressure is 130/80 and her pulse is 74. Her respiration is 18 and oxygen saturation is 97%.  The patient's body mass index is 28.34 kg/(m^2).  Physical Exam  Constitutional: She is oriented to person, place, and time. She appears well-developed and well-nourished. No distress.  HENT:  Right Ear: Hearing, tympanic membrane, external ear and ear canal normal.  Left Ear: Hearing, tympanic membrane, external ear and ear canal normal.  Nose: Mucosal edema present. Right sinus exhibits no maxillary sinus tenderness and no frontal sinus tenderness. Left sinus exhibits no maxillary sinus tenderness and no frontal sinus tenderness.  Mouth/Throat: Uvula is midline, oropharynx is clear and moist and mucous membranes are normal.  Cardiovascular: Normal rate, regular rhythm and normal heart sounds.   Respiratory: Effort normal and breath sounds normal. She has no wheezes. She has no rales.  Neurological: She is alert and oriented to person, place, and time.  Skin: Skin is warm and dry. She is not diaphoretic.  Psychiatric: She has a normal mood and affect.    No results found for this or any previous visit (from the past 24 hour(s)).  ASSESSMENT & PLAN  Sarah Zuniga was seen today for cough.  Diagnoses and all orders for this visit:  Viral URI with cough: Patient with likely another cold.  Advised that she reinstate previous treatment.  If her symptoms are no better in 3 days to fill the antibiotic provided for problem 2.    -  loratadine-pseudoephedrine (CLARITIN-D 12 HOUR) 5-120 MG per tablet; Take 1 tablet by mouth daily with breakfast. -     naproxen (NAPROSYN) 500 MG tablet; Take 1 tablet (500 mg total) by mouth 2 (two) times daily with a meal.  Lower respiratory tract infectious disease -     azithromycin  (ZITHROMAX) 250 MG tablet; Please start on 9/23 if your symptoms are not better.   The patient was advised to call or come back to clinic if she does not see an improvement in symptoms, or worsens with the above plan.   Philis Fendt, MHS, PA-C [facility] 10/26/2014 12:03 AM

## 2014-11-22 ENCOUNTER — Other Ambulatory Visit: Payer: Self-pay | Admitting: Family Medicine

## 2014-12-08 HISTORY — PX: EYE SURGERY: SHX253

## 2014-12-13 ENCOUNTER — Encounter: Payer: Self-pay | Admitting: Internal Medicine

## 2015-01-18 ENCOUNTER — Encounter: Payer: Self-pay | Admitting: Behavioral Health

## 2015-01-18 ENCOUNTER — Telehealth: Payer: Self-pay | Admitting: Behavioral Health

## 2015-01-18 NOTE — Telephone Encounter (Signed)
Unable to reach patient at time of Pre-Visit Call.  Left message for patient to return call when available.    

## 2015-01-18 NOTE — Telephone Encounter (Signed)
Pre-Visit Call completed with patient and chart updated.   Pre-Visit Info documented in Specialty Comments under [employer].    

## 2015-01-18 NOTE — Addendum Note (Signed)
Addended by: Eduard Roux E on: 01/18/2015 04:25 PM   Modules accepted: Orders, Medications

## 2015-01-19 ENCOUNTER — Other Ambulatory Visit (HOSPITAL_COMMUNITY)
Admission: RE | Admit: 2015-01-19 | Discharge: 2015-01-19 | Disposition: A | Discharge status: Home / Self Care | Payer: Commercial Managed Care - HMO | Source: Ambulatory Visit | Attending: Family Medicine | Admitting: Family Medicine

## 2015-01-19 ENCOUNTER — Ambulatory Visit (INDEPENDENT_AMBULATORY_CARE_PROVIDER_SITE_OTHER): Payer: Commercial Managed Care - HMO | Admitting: Family Medicine

## 2015-01-19 ENCOUNTER — Encounter: Payer: Self-pay | Admitting: Family Medicine

## 2015-01-19 VITALS — BP 112/76 | HR 72 | Temp 97.8°F | Ht 66.0 in | Wt 161.6 lb

## 2015-01-19 DIAGNOSIS — E785 Hyperlipidemia, unspecified: Secondary | ICD-10-CM

## 2015-01-19 DIAGNOSIS — Z23 Encounter for immunization: Secondary | ICD-10-CM | POA: Diagnosis not present

## 2015-01-19 DIAGNOSIS — R319 Hematuria, unspecified: Secondary | ICD-10-CM

## 2015-01-19 DIAGNOSIS — Z1151 Encounter for screening for human papillomavirus (HPV): Secondary | ICD-10-CM | POA: Insufficient documentation

## 2015-01-19 DIAGNOSIS — Z124 Encounter for screening for malignant neoplasm of cervix: Secondary | ICD-10-CM

## 2015-01-19 DIAGNOSIS — Z1159 Encounter for screening for other viral diseases: Secondary | ICD-10-CM

## 2015-01-19 DIAGNOSIS — Z0000 Encounter for general adult medical examination without abnormal findings: Secondary | ICD-10-CM

## 2015-01-19 DIAGNOSIS — Z01419 Encounter for gynecological examination (general) (routine) without abnormal findings: Secondary | ICD-10-CM | POA: Diagnosis present

## 2015-01-19 DIAGNOSIS — E010 Iodine-deficiency related diffuse (endemic) goiter: Secondary | ICD-10-CM

## 2015-01-19 DIAGNOSIS — Z114 Encounter for screening for human immunodeficiency virus [HIV]: Secondary | ICD-10-CM

## 2015-01-19 DIAGNOSIS — E049 Nontoxic goiter, unspecified: Secondary | ICD-10-CM

## 2015-01-19 DIAGNOSIS — I10 Essential (primary) hypertension: Secondary | ICD-10-CM

## 2015-01-19 LAB — POCT URINALYSIS DIPSTICK
Bilirubin, UA: NEGATIVE
Glucose, UA: NEGATIVE
Ketones, UA: NEGATIVE
Leukocytes, UA: NEGATIVE
Nitrite, UA: NEGATIVE
Protein, UA: NEGATIVE
Spec Grav, UA: 1.015
Urobilinogen, UA: 0.2
pH, UA: 6

## 2015-01-19 MED ORDER — LISINOPRIL 20 MG PO TABS: 20.0000 mg | ORAL_TABLET | Freq: Every day | ORAL | Status: DC

## 2015-01-19 NOTE — Progress Notes (Signed)
Subjective:     Sarah Zuniga is a 59 y.o. female and is here for a comprehensive physical exam. The patient reports no problems.  Social History   Social History  . Marital Status: Divorced    Spouse Name: N/A  . Number of Children: 0  . Years of Education: college 3   Occupational History  . [employer] account executive   . [employer]    .     Social History Main Topics  . Smoking status: Never Smoker   . Smokeless tobacco: Never Used  . Alcohol Use: Yes     Comment: wine weekly  . Drug Use: No  . Sexual Activity:    Partners: Male   Other Topics Concern  . Not on file   Social History Narrative   Exercise-- 4x a week   Health Maintenance  Topic Date Due  . Hepatitis C Screening  Jul 03, 1955  . HIV Screening  01/25/1971  . PAP SMEAR  12/05/2014  . TETANUS/TDAP  12/14/2014  . COLONOSCOPY  12/21/2014  . INFLUENZA VACCINE  09/05/2015  . MAMMOGRAM  09/22/2016    The following portions of the patient's history were reviewed and updated as appropriate:  She  has a past medical history of Hyperlipidemia; Hypertension; Diplopia (04/28/2013); Pigment dispersion syndrome of both eyes; and Cataract. She  does not have any pertinent problems on file. She  has past surgical history that includes Tonsillectomy; Wisdom tooth extraction; and Eye surgery (Left, 12/08/14). Her family history includes Cancer in her father; Coronary artery disease in her other; Diabetes in her mother; Hyperlipidemia in her father, mother, and sister; Hypertension in her father, mother, and sister; Kidney disease in her mother; Stroke in her mother. She  reports that she has never smoked. She has never used smokeless tobacco. She reports that she drinks alcohol. She reports that she does not use illicit drugs. She has a current medication list which includes the following prescription(s): cetirizine, lisinopril, multivitamin, and simvastatin. Current Outpatient Prescriptions on File Prior to Visit  Medication Sig  Dispense Refill  . cetirizine (KLS ALLER-TEC) 10 MG tablet Take 10 mg by mouth daily.      . Multiple Vitamin (MULTIVITAMIN) tablet Take 1 tablet by mouth daily.      . simvastatin (ZOCOR) 40 MG tablet Take 1 tablet (40 mg total) by mouth at bedtime. 30 tablet 2   No current facility-administered medications on file prior to visit.   She has No Known Allergies..  Review of Systems Review of Systems  Constitutional: Negative for activity change, appetite change and fatigue.  HENT: Negative for hearing loss, congestion, tinnitus and ear discharge.  dentist q51mEyes: Negative for visual disturbance (see optho q1y -- vision corrected to 20/20 with glasses).  Respiratory: Negative for cough, chest tightness and shortness of breath.   Cardiovascular: Negative for chest pain, palpitations and leg swelling.  Gastrointestinal: Negative for abdominal pain, diarrhea, constipation and abdominal distention.  Genitourinary: Negative for urgency, frequency, decreased urine volume and difficulty urinating.  Musculoskeletal: Negative for back pain, arthralgias and gait problem.  Skin: Negative for color change, pallor and rash.  Neurological: Negative for dizziness, light-headedness, numbness and headaches.  Hematological: Negative for adenopathy. Does not bruise/bleed easily.  Psychiatric/Behavioral: Negative for suicidal ideas, confusion, sleep disturbance, self-injury, dysphoric mood, decreased concentration and agitation.       Objective:    BP 112/76 mmHg  Pulse 72  Temp(Src) 97.8 F (36.6 C) (Oral)  Ht _0  (1.676 m)  Wt 161 lb 9.6 oz (73.301 kg)  BMI 26.10 kg/m2  SpO2 98% General appearance: alert, cooperative, appears stated age and no distress Head: Normocephalic, without obvious abnormality, atraumatic Eyes: conjunctivae/corneas clear. PERRL, EOM's intact. Fundi benign. Ears: normal TM's and external ear canals both ears Nose: Nares normal. Septum midline. Mucosa normal. No  drainage or sinus tenderness. Throat: normal findings: lips normal without lesions, buccal mucosa normal, gums healthy, teeth intact, non-carious, palate normal, tongue midline and normal, soft palate, uvula, and tonsils normal, palpation of salivary glands negative and oropharynx pink & moist without lesions or evidence of thrush Neck: no adenopathy, no carotid bruit, no JVD, supple, symmetrical, trachea midline and thyroid: enlarged Back: symmetric, no curvature. ROM normal. No CVA tenderness. Lungs: clear to auscultation bilaterally Breasts: normal appearance, no masses or tenderness Heart: regular rate and rhythm, S1, S2 normal, no murmur, click, rub or gallop Abdomen: soft, non-tender; bowel sounds normal; no masses,  no organomegaly Pelvic: cervix normal in appearance, external genitalia normal, no adnexal masses or tenderness, no cervical motion tenderness, rectovaginal septum normal, uterus normal size, shape, and consistency, vagina normal without discharge and pap done, rectal heme neg Extremities: extremities normal, atraumatic, no cyanosis or edema Pulses: 2+ and symmetric Skin: Skin color, texture, turgor normal. No rashes or lesions Lymph nodes: Cervical, supraclavicular, and axillary nodes normal. Neurologic: Alert and oriented X 3, normal strength and tone. Normal symmetric reflexes. Normal coordination and gait Psych- no depression, no anxiety      Assessment:    Healthy female exam.      Plan:  ghm utd Check labs   See After Visit Summary for Counseling Recommendations    1. Preventative health care   - Comp Met (CMET) - CBC with Differential/Platelet - Lipid panel - POCT urinalysis dipstick - TSH  2. Hyperlipidemia LDL goal <100   - Comp Met (CMET) - CBC with Differential/Platelet - Lipid panel - POCT urinalysis dipstick - TSH  3. Need for hepatitis C screening test   - Hepatitis C antibody  4. Encounter for screening for HIV   - HIV  antibody  5. Essential hypertension   - lisinopril (PRINIVIL,ZESTRIL) 20 MG tablet; Take 1 tablet (20 mg total) by mouth daily.  Dispense: 90 tablet; Refill: 3  6. Thyromegaly   - US Soft Tissue Head/Neck; Future

## 2015-01-19 NOTE — Addendum Note (Signed)
Addended by: Ewing Schlein on: 01/19/2015 05:01 PM   Modules accepted: Orders

## 2015-01-19 NOTE — Addendum Note (Signed)
Addended by: Caffie Pinto on: 01/19/2015 05:04 PM   Modules accepted: Orders

## 2015-01-19 NOTE — Patient Instructions (Signed)
Preventive Care for Adults, Female A healthy lifestyle and preventive care can promote health and wellness. Preventive health guidelines for women include the following key practices.  A routine yearly physical is a good way to check with your health care provider about your health and preventive screening. It is a chance to share any concerns and updates on your health and to receive a thorough exam.  Visit your dentist for a routine exam and preventive care every 6 months. Brush your teeth twice a day and floss once a day. Good oral hygiene prevents tooth decay and gum disease.  The frequency of eye exams is based on your age, health, family medical history, use of contact lenses, and other factors. Follow your health care provider's recommendations for frequency of eye exams.  Eat a healthy diet. Foods like vegetables, fruits, whole grains, low-fat dairy products, and lean protein foods contain the nutrients you need without too many calories. Decrease your intake of foods high in solid fats, added sugars, and salt. Eat the right amount of calories for you.Get information about a proper diet from your health care provider, if necessary.  Regular physical exercise is one of the most important things you can do for your health. Most adults should get at least 150 minutes of moderate-intensity exercise (any activity that increases your heart rate and causes you to sweat) each week. In addition, most adults need muscle-strengthening exercises on 2 or more days a week.  Maintain a healthy weight. The body mass index (BMI) is a screening tool to identify possible weight problems. It provides an estimate of body fat based on height and weight. Your health care provider can find your BMI and can help you achieve or maintain a healthy weight.For adults 20 years and older:  A BMI below 18.5 is considered underweight.  A BMI of 18.5 to 24.9 is normal.  A BMI of 25 to 29.9 is considered overweight.  A  BMI of 30 and above is considered obese.  Maintain normal blood lipids and cholesterol levels by exercising and minimizing your intake of saturated fat. Eat a balanced diet with plenty of fruit and vegetables. Blood tests for lipids and cholesterol should begin at age 45 and be repeated every 5 years. If your lipid or cholesterol levels are high, you are over 50, or you are at high risk for heart disease, you may need your cholesterol levels checked more frequently.Ongoing high lipid and cholesterol levels should be treated with medicines if diet and exercise are not working.  If you smoke, find out from your health care provider how to quit. If you do not use tobacco, do not start.  Lung cancer screening is recommended for adults aged 45-80 years who are at high risk for developing lung cancer because of a history of smoking. A yearly low-dose CT scan of the lungs is recommended for people who have at least a 30-pack-year history of smoking and are a current smoker or have quit within the past 15 years. A pack year of smoking is smoking an average of 1 pack of cigarettes a day for 1 year (for example: 1 pack a day for 30 years or 2 packs a day for 15 years). Yearly screening should continue until the smoker has stopped smoking for at least 15 years. Yearly screening should be stopped for people who develop a health problem that would prevent them from having lung cancer treatment.  If you are pregnant, do not drink alcohol. If you are  breastfeeding, be very cautious about drinking alcohol. If you are not pregnant and choose to drink alcohol, do not have more than 1 drink per day. One drink is considered to be 12 ounces (355 mL) of beer, 5 ounces (148 mL) of wine, or 1.5 ounces (44 mL) of liquor.  Avoid use of street drugs. Do not share needles with anyone. Ask for help if you need support or instructions about stopping the use of drugs.  High blood pressure causes heart disease and increases the risk  of stroke. Your blood pressure should be checked at least every 1 to 2 years. Ongoing high blood pressure should be treated with medicines if weight loss and exercise do not work.  If you are 55-79 years old, ask your health care provider if you should take aspirin to prevent strokes.  Diabetes screening is done by taking a blood sample to check your blood glucose level after you have not eaten for a certain period of time (fasting). If you are not overweight and you do not have risk factors for diabetes, you should be screened once every 3 years starting at age 45. If you are overweight or obese and you are 40-70 years of age, you should be screened for diabetes every year as part of your cardiovascular risk assessment.  Breast cancer screening is essential preventive care for women. You should practice "breast self-awareness." This means understanding the normal appearance and feel of your breasts and may include breast self-examination. Any changes detected, no matter how small, should be reported to a health care provider. Women in their 20s and 30s should have a clinical breast exam (CBE) by a health care provider as part of a regular health exam every 1 to 3 years. After age 40, women should have a CBE every year. Starting at age 40, women should consider having a mammogram (breast X-ray test) every year. Women who have a family history of breast cancer should talk to their health care provider about genetic screening. Women at a high risk of breast cancer should talk to their health care providers about having an MRI and a mammogram every year.  Breast cancer gene (BRCA)-related cancer risk assessment is recommended for women who have family members with BRCA-related cancers. BRCA-related cancers include breast, ovarian, tubal, and peritoneal cancers. Having family members with these cancers may be associated with an increased risk for harmful changes (mutations) in the breast cancer genes BRCA1 and  BRCA2. Results of the assessment will determine the need for genetic counseling and BRCA1 and BRCA2 testing.  Your health care provider may recommend that you be screened regularly for cancer of the pelvic organs (ovaries, uterus, and vagina). This screening involves a pelvic examination, including checking for microscopic changes to the surface of your cervix (Pap test). You may be encouraged to have this screening done every 3 years, beginning at age 21.  For women ages 30-65, health care providers may recommend pelvic exams and Pap testing every 3 years, or they may recommend the Pap and pelvic exam, combined with testing for human papilloma virus (HPV), every 5 years. Some types of HPV increase your risk of cervical cancer. Testing for HPV may also be done on women of any age with unclear Pap test results.  Other health care providers may not recommend any screening for nonpregnant women who are considered low risk for pelvic cancer and who do not have symptoms. Ask your health care provider if a screening pelvic exam is right for   you.  If you have had past treatment for cervical cancer or a condition that could lead to cancer, you need Pap tests and screening for cancer for at least 20 years after your treatment. If Pap tests have been discontinued, your risk factors (such as having a new sexual partner) need to be reassessed to determine if screening should resume. Some women have medical problems that increase the chance of getting cervical cancer. In these cases, your health care provider may recommend more frequent screening and Pap tests.  Colorectal cancer can be detected and often prevented. Most routine colorectal cancer screening begins at the age of 50 years and continues through age 75 years. However, your health care provider may recommend screening at an earlier age if you have risk factors for colon cancer. On a yearly basis, your health care provider may provide home test kits to check  for hidden blood in the stool. Use of a small camera at the end of a tube, to directly examine the colon (sigmoidoscopy or colonoscopy), can detect the earliest forms of colorectal cancer. Talk to your health care provider about this at age 50, when routine screening begins. Direct exam of the colon should be repeated every 5-10 years through age 75 years, unless early forms of precancerous polyps or small growths are found.  People who are at an increased risk for hepatitis B should be screened for this virus. You are considered at high risk for hepatitis B if:  You were born in a country where hepatitis B occurs often. Talk with your health care provider about which countries are considered high risk.  Your parents were born in a high-risk country and you have not received a shot to protect against hepatitis B (hepatitis B vaccine).  You have HIV or AIDS.  You use needles to inject street drugs.  You live with, or have sex with, someone who has hepatitis B.  You get hemodialysis treatment.  You take certain medicines for conditions like cancer, organ transplantation, and autoimmune conditions.  Hepatitis C blood testing is recommended for all people born from 1945 through 1965 and any individual with known risks for hepatitis C.  Practice safe sex. Use condoms and avoid high-risk sexual practices to reduce the spread of sexually transmitted infections (STIs). STIs include gonorrhea, chlamydia, syphilis, trichomonas, herpes, HPV, and human immunodeficiency virus (HIV). Herpes, HIV, and HPV are viral illnesses that have no cure. They can result in disability, cancer, and death.  You should be screened for sexually transmitted illnesses (STIs) including gonorrhea and chlamydia if:  You are sexually active and are younger than 24 years.  You are older than 24 years and your health care provider tells you that you are at risk for this type of infection.  Your sexual activity has changed  since you were last screened and you are at an increased risk for chlamydia or gonorrhea. Ask your health care provider if you are at risk.  If you are at risk of being infected with HIV, it is recommended that you take a prescription medicine daily to prevent HIV infection. This is called preexposure prophylaxis (PrEP). You are considered at risk if:  You are sexually active and do not regularly use condoms or know the HIV status of your partner(s).  You take drugs by injection.  You are sexually active with a partner who has HIV.  Talk with your health care provider about whether you are at high risk of being infected with HIV. If   you choose to begin PrEP, you should first be tested for HIV. You should then be tested every 3 months for as long as you are taking PrEP.  Osteoporosis is a disease in which the bones lose minerals and strength with aging. This can result in serious bone fractures or breaks. The risk of osteoporosis can be identified using a bone density scan. Women ages 67 years and over and women at risk for fractures or osteoporosis should discuss screening with their health care providers. Ask your health care provider whether you should take a calcium supplement or vitamin D to reduce the rate of osteoporosis.  Menopause can be associated with physical symptoms and risks. Hormone replacement therapy is available to decrease symptoms and risks. You should talk to your health care provider about whether hormone replacement therapy is right for you.  Use sunscreen. Apply sunscreen liberally and repeatedly throughout the day. You should seek shade when your shadow is shorter than you. Protect yourself by wearing long sleeves, pants, a wide-brimmed hat, and sunglasses year round, whenever you are outdoors.  Once a month, do a whole body skin exam, using a mirror to look at the skin on your back. Tell your health care provider of new moles, moles that have irregular borders, moles that  are larger than a pencil eraser, or moles that have changed in shape or color.  Stay current with required vaccines (immunizations).  Influenza vaccine. All adults should be immunized every year.  Tetanus, diphtheria, and acellular pertussis (Td, Tdap) vaccine. Pregnant women should receive 1 dose of Tdap vaccine during each pregnancy. The dose should be obtained regardless of the length of time since the last dose. Immunization is preferred during the 27th-36th week of gestation. An adult who has not previously received Tdap or who does not know her vaccine status should receive 1 dose of Tdap. This initial dose should be followed by tetanus and diphtheria toxoids (Td) booster doses every 10 years. Adults with an unknown or incomplete history of completing a 3-dose immunization series with Td-containing vaccines should begin or complete a primary immunization series including a Tdap dose. Adults should receive a Td booster every 10 years.  Varicella vaccine. An adult without evidence of immunity to varicella should receive 2 doses or a second dose if she has previously received 1 dose. Pregnant females who do not have evidence of immunity should receive the first dose after pregnancy. This first dose should be obtained before leaving the health care facility. The second dose should be obtained 4-8 weeks after the first dose.  Human papillomavirus (HPV) vaccine. Females aged 13-26 years who have not received the vaccine previously should obtain the 3-dose series. The vaccine is not recommended for use in pregnant females. However, pregnancy testing is not needed before receiving a dose. If a female is found to be pregnant after receiving a dose, no treatment is needed. In that case, the remaining doses should be delayed until after the pregnancy. Immunization is recommended for any person with an immunocompromised condition through the age of 61 years if she did not get any or all doses earlier. During the  3-dose series, the second dose should be obtained 4-8 weeks after the first dose. The third dose should be obtained 24 weeks after the first dose and 16 weeks after the second dose.  Zoster vaccine. One dose is recommended for adults aged 30 years or older unless certain conditions are present.  Measles, mumps, and rubella (MMR) vaccine. Adults born  before 1957 generally are considered immune to measles and mumps. Adults born in 1957 or later should have 1 or more doses of MMR vaccine unless there is a contraindication to the vaccine or there is laboratory evidence of immunity to each of the three diseases. A routine second dose of MMR vaccine should be obtained at least 28 days after the first dose for students attending postsecondary schools, health care workers, or international travelers. People who received inactivated measles vaccine or an unknown type of measles vaccine during 1963-1967 should receive 2 doses of MMR vaccine. People who received inactivated mumps vaccine or an unknown type of mumps vaccine before 1979 and are at high risk for mumps infection should consider immunization with 2 doses of MMR vaccine. For females of childbearing age, rubella immunity should be determined. If there is no evidence of immunity, females who are not pregnant should be vaccinated. If there is no evidence of immunity, females who are pregnant should delay immunization until after pregnancy. Unvaccinated health care workers born before 1957 who lack laboratory evidence of measles, mumps, or rubella immunity or laboratory confirmation of disease should consider measles and mumps immunization with 2 doses of MMR vaccine or rubella immunization with 1 dose of MMR vaccine.  Pneumococcal 13-valent conjugate (PCV13) vaccine. When indicated, a person who is uncertain of his immunization history and has no record of immunization should receive the PCV13 vaccine. All adults 65 years of age and older should receive this  vaccine. An adult aged 19 years or older who has certain medical conditions and has not been previously immunized should receive 1 dose of PCV13 vaccine. This PCV13 should be followed with a dose of pneumococcal polysaccharide (PPSV23) vaccine. Adults who are at high risk for pneumococcal disease should obtain the PPSV23 vaccine at least 8 weeks after the dose of PCV13 vaccine. Adults older than 59 years of age who have normal immune system function should obtain the PPSV23 vaccine dose at least 1 year after the dose of PCV13 vaccine.  Pneumococcal polysaccharide (PPSV23) vaccine. When PCV13 is also indicated, PCV13 should be obtained first. All adults aged 65 years and older should be immunized. An adult younger than age 65 years who has certain medical conditions should be immunized. Any person who resides in a nursing home or long-term care facility should be immunized. An adult smoker should be immunized. People with an immunocompromised condition and certain other conditions should receive both PCV13 and PPSV23 vaccines. People with human immunodeficiency virus (HIV) infection should be immunized as soon as possible after diagnosis. Immunization during chemotherapy or radiation therapy should be avoided. Routine use of PPSV23 vaccine is not recommended for American Indians, [place] Natives, or people younger than 65 years unless there are medical conditions that require PPSV23 vaccine. When indicated, people who have unknown immunization and have no record of immunization should receive PPSV23 vaccine. One-time revaccination 5 years after the first dose of PPSV23 is recommended for people aged 19-64 years who have chronic kidney failure, nephrotic syndrome, asplenia, or immunocompromised conditions. People who received 1-2 doses of PPSV23 before age 65 years should receive another dose of PPSV23 vaccine at age 65 years or later if at least 5 years have passed since the previous dose. Doses of PPSV23 are not  needed for people immunized with PPSV23 at or after age 65 years.  Meningococcal vaccine. Adults with asplenia or persistent complement component deficiencies should receive 2 doses of quadrivalent meningococcal conjugate (MenACWY-D) vaccine. The doses should be obtained   at least 2 months apart. Microbiologists working with certain meningococcal bacteria, [place] recruits, people at risk during an outbreak, and people who travel to or live in countries with a high rate of meningitis should be immunized. A first-year college student up through age 34 years who is living in a residence hall should receive a dose if she did not receive a dose on or after her 16th birthday. Adults who have certain high-risk conditions should receive one or more doses of vaccine.  Hepatitis A vaccine. Adults who wish to be protected from this disease, have certain high-risk conditions, work with hepatitis A-infected animals, work in hepatitis A research labs, or travel to or work in countries with a high rate of hepatitis A should be immunized. Adults who were previously unvaccinated and who anticipate close contact with an international adoptee during the first 60 days after arrival in the [place] from a country with a high rate of hepatitis A should be immunized.  Hepatitis B vaccine. Adults who wish to be protected from this disease, have certain high-risk conditions, may be exposed to blood or other infectious body fluids, are household contacts or sex partners of hepatitis B positive people, are clients or workers in certain care facilities, or travel to or work in countries with a high rate of hepatitis B should be immunized.  Haemophilus influenzae type b (Hib) vaccine. A previously unvaccinated person with asplenia or sickle cell disease or having a scheduled splenectomy should receive 1 dose of Hib vaccine. Regardless of previous immunization, a recipient of a hematopoietic stem cell transplant should receive a  3-dose series 6-12 months after her successful transplant. Hib vaccine is not recommended for adults with HIV infection. Preventive Services / Frequency Ages 35 to 4 years  Blood pressure check.** / Every 3-5 years.  Lipid and cholesterol check.** / Every 5 years beginning at age 60.  Clinical breast exam.** / Every 3 years for women in their 71s and 10s.  BRCA-related cancer risk assessment.** / For women who have family members with a BRCA-related cancer (breast, ovarian, tubal, or peritoneal cancers).  Pap test.** / Every 2 years from ages 76 through 26. Every 3 years starting at age 61 through age 76 or 93 with a history of 3 consecutive normal Pap tests.  HPV screening.** / Every 3 years from ages 37 through ages 60 to 51 with a history of 3 consecutive normal Pap tests.  Hepatitis C blood test.** / For any individual with known risks for hepatitis C.  Skin self-exam. / Monthly.  Influenza vaccine. / Every year.  Tetanus, diphtheria, and acellular pertussis (Tdap, Td) vaccine.** / Consult your health care provider. Pregnant women should receive 1 dose of Tdap vaccine during each pregnancy. 1 dose of Td every 10 years.  Varicella vaccine.** / Consult your health care provider. Pregnant females who do not have evidence of immunity should receive the first dose after pregnancy.  HPV vaccine. / 3 doses over 6 months, if 93 and younger. The vaccine is not recommended for use in pregnant females. However, pregnancy testing is not needed before receiving a dose.  Measles, mumps, rubella (MMR) vaccine.** / You need at least 1 dose of MMR if you were born in 1957 or later. You may also need a 2nd dose. For females of childbearing age, rubella immunity should be determined. If there is no evidence of immunity, females who are not pregnant should be vaccinated. If there is no evidence of immunity, females who are  pregnant should delay immunization until after pregnancy.  Pneumococcal  13-valent conjugate (PCV13) vaccine.** / Consult your health care provider.  Pneumococcal polysaccharide (PPSV23) vaccine.** / 1 to 2 doses if you smoke cigarettes or if you have certain conditions.  Meningococcal vaccine.** / 1 dose if you are age 68 to 8 years and a Market researcher living in a residence hall, or have one of several medical conditions, you need to get vaccinated against meningococcal disease. You may also need additional booster doses.  Hepatitis A vaccine.** / Consult your health care provider.  Hepatitis B vaccine.** / Consult your health care provider.  Haemophilus influenzae type b (Hib) vaccine.** / Consult your health care provider. Ages 7 to 53 years  Blood pressure check.** / Every year.  Lipid and cholesterol check.** / Every 5 years beginning at age 25 years.  Lung cancer screening. / Every year if you are aged 11-80 years and have a 30-pack-year history of smoking and currently smoke or have quit within the past 15 years. Yearly screening is stopped once you have quit smoking for at least 15 years or develop a health problem that would prevent you from having lung cancer treatment.  Clinical breast exam.** / Every year after age 48 years.  BRCA-related cancer risk assessment.** / For women who have family members with a BRCA-related cancer (breast, ovarian, tubal, or peritoneal cancers).  Mammogram.** / Every year beginning at age 41 years and continuing for as long as you are in good health. Consult with your health care provider.  Pap test.** / Every 3 years starting at age 65 years through age 37 or 70 years with a history of 3 consecutive normal Pap tests.  HPV screening.** / Every 3 years from ages 72 years through ages 60 to 40 years with a history of 3 consecutive normal Pap tests.  Fecal occult blood test (FOBT) of stool. / Every year beginning at age 21 years and continuing until age 5 years. You may not need to do this test if you get  a colonoscopy every 10 years.  Flexible sigmoidoscopy or colonoscopy.** / Every 5 years for a flexible sigmoidoscopy or every 10 years for a colonoscopy beginning at age 35 years and continuing until age 48 years.  Hepatitis C blood test.** / For all people born from [birth year] through 1965 and any individual with known risks for hepatitis C.  Skin self-exam. / Monthly.  Influenza vaccine. / Every year.  Tetanus, diphtheria, and acellular pertussis (Tdap/Td) vaccine.** / Consult your health care provider. Pregnant women should receive 1 dose of Tdap vaccine during each pregnancy. 1 dose of Td every 10 years.  Varicella vaccine.** / Consult your health care provider. Pregnant females who do not have evidence of immunity should receive the first dose after pregnancy.  Zoster vaccine.** / 1 dose for adults aged 30 years or older.  Measles, mumps, rubella (MMR) vaccine.** / You need at least 1 dose of MMR if you were born in 1957 or later. You may also need a second dose. For females of childbearing age, rubella immunity should be determined. If there is no evidence of immunity, females who are not pregnant should be vaccinated. If there is no evidence of immunity, females who are pregnant should delay immunization until after pregnancy.  Pneumococcal 13-valent conjugate (PCV13) vaccine.** / Consult your health care provider.  Pneumococcal polysaccharide (PPSV23) vaccine.** / 1 to 2 doses if you smoke cigarettes or if you have certain conditions.  Meningococcal vaccine.** /  Consult your health care provider.  Hepatitis A vaccine.** / Consult your health care provider.  Hepatitis B vaccine.** / Consult your health care provider.  Haemophilus influenzae type b (Hib) vaccine.** / Consult your health care provider. Ages 64 years and over  Blood pressure check.** / Every year.  Lipid and cholesterol check.** / Every 5 years beginning at age 23 years.  Lung cancer screening. / Every year if you  are aged 16-80 years and have a 30-pack-year history of smoking and currently smoke or have quit within the past 15 years. Yearly screening is stopped once you have quit smoking for at least 15 years or develop a health problem that would prevent you from having lung cancer treatment.  Clinical breast exam.** / Every year after age 74 years.  BRCA-related cancer risk assessment.** / For women who have family members with a BRCA-related cancer (breast, ovarian, tubal, or peritoneal cancers).  Mammogram.** / Every year beginning at age 44 years and continuing for as long as you are in good health. Consult with your health care provider.  Pap test.** / Every 3 years starting at age 58 years through age 22 or 39 years with 3 consecutive normal Pap tests. Testing can be stopped between 65 and 70 years with 3 consecutive normal Pap tests and no abnormal Pap or HPV tests in the past 10 years.  HPV screening.** / Every 3 years from ages 64 years through ages 70 or 61 years with a history of 3 consecutive normal Pap tests. Testing can be stopped between 65 and 70 years with 3 consecutive normal Pap tests and no abnormal Pap or HPV tests in the past 10 years.  Fecal occult blood test (FOBT) of stool. / Every year beginning at age 40 years and continuing until age 27 years. You may not need to do this test if you get a colonoscopy every 10 years.  Flexible sigmoidoscopy or colonoscopy.** / Every 5 years for a flexible sigmoidoscopy or every 10 years for a colonoscopy beginning at age 7 years and continuing until age 32 years.  Hepatitis C blood test.** / For all people born from [birth year] through 1965 and any individual with known risks for hepatitis C.  Osteoporosis screening.** / A one-time screening for women ages 30 years and over and women at risk for fractures or osteoporosis.  Skin self-exam. / Monthly.  Influenza vaccine. / Every year.  Tetanus, diphtheria, and acellular pertussis (Tdap/Td)  vaccine.** / 1 dose of Td every 10 years.  Varicella vaccine.** / Consult your health care provider.  Zoster vaccine.** / 1 dose for adults aged 35 years or older.  Pneumococcal 13-valent conjugate (PCV13) vaccine.** / Consult your health care provider.  Pneumococcal polysaccharide (PPSV23) vaccine.** / 1 dose for all adults aged 46 years and older.  Meningococcal vaccine.** / Consult your health care provider.  Hepatitis A vaccine.** / Consult your health care provider.  Hepatitis B vaccine.** / Consult your health care provider.  Haemophilus influenzae type b (Hib) vaccine.** / Consult your health care provider. ** Family history and personal history of risk and conditions may change your health care provider's recommendations.   This information is not intended to replace advice given to you by your health care provider. Make sure you discuss any questions you have with your health care provider.   Document Released: 03/19/2001 Document Revised: 02/11/2014 Document Reviewed: 06/18/2010 Elsevier Interactive Patient Education [employer].

## 2015-01-19 NOTE — Progress Notes (Signed)
Pre visit review using our clinic review tool, if applicable. No additional management support is needed unless otherwise documented below in the visit note. 

## 2015-01-20 LAB — HIV ANTIBODY (ROUTINE TESTING W REFLEX): HIV: NONREACTIVE

## 2015-01-20 LAB — COMPREHENSIVE METABOLIC PANEL
ALBUMIN: 4.7 g/dL (ref 3.5–5.2)
ALK PHOS: 48 U/L (ref 39–117)
ALT: 19 U/L (ref 0–35)
AST: 23 U/L (ref 0–37)
BILIRUBIN TOTAL: 0.7 mg/dL (ref 0.2–1.2)
BUN: 15 mg/dL (ref 6–23)
CALCIUM: 10.2 mg/dL (ref 8.4–10.5)
CO2: 27 meq/L (ref 19–32)
CREATININE: 0.82 mg/dL (ref 0.40–1.20)
Chloride: 104 mEq/L (ref 96–112)
GFR: 75.84 mL/min (ref >60.00)
Glucose, Bld: 81 mg/dL (ref 70–99)
Potassium: 4.1 mEq/L (ref 3.5–5.1)
Sodium: 140 mEq/L (ref 135–145)
TOTAL PROTEIN: 7.7 g/dL (ref 6.0–8.3)

## 2015-01-20 LAB — CBC WITH DIFFERENTIAL/PLATELET
BASOS ABS: 0 10*3/uL (ref 0.0–0.1)
Basophils Relative: 0.6 % (ref 0.0–3.0)
EOS ABS: 0.1 10*3/uL (ref 0.0–0.7)
Eosinophils Relative: 1.3 % (ref 0.0–5.0)
HEMATOCRIT: 44.7 % (ref 36.0–46.0)
HEMOGLOBIN: 15.2 g/dL — AB (ref 12.0–15.0)
LYMPHS PCT: 39.8 % (ref 12.0–46.0)
Lymphs Abs: 2.5 10*3/uL (ref 0.7–4.0)
MCHC: 34 g/dL (ref 30.0–36.0)
MCV: 91.2 fl (ref 78.0–100.0)
MONOS PCT: 7.7 % (ref 3.0–12.0)
Monocytes Absolute: 0.5 10*3/uL (ref 0.1–1.0)
Neutro Abs: 3.2 10*3/uL (ref 1.4–7.7)
Neutrophils Relative %: 50.6 % (ref 43.0–77.0)
Platelets: 195 10*3/uL (ref 150.0–400.0)
RBC: 4.89 Mil/uL (ref 3.87–5.11)
RDW: 13 % (ref 11.5–15.5)
WBC: 6.3 10*3/uL (ref 4.0–10.5)

## 2015-01-20 LAB — LIPID PANEL
CHOL/HDL RATIO: 4
Cholesterol: 225 mg/dL — ABNORMAL HIGH (ref 0–200)
HDL: 52.4 mg/dL (ref >39.00)
LDL Cholesterol: 147 mg/dL — ABNORMAL HIGH (ref 0–99)
NONHDL: 172.49
TRIGLYCERIDES: 127 mg/dL (ref 0.0–149.0)
VLDL: 25.4 mg/dL (ref 0.0–40.0)

## 2015-01-20 LAB — TSH: TSH: 0.6 u[IU]/mL (ref 0.35–4.50)

## 2015-01-20 LAB — HEPATITIS C ANTIBODY: HCV Ab: NEGATIVE

## 2015-01-21 LAB — URINE CULTURE

## 2015-01-23 LAB — CYTOLOGY - PAP

## 2015-01-31 ENCOUNTER — Other Ambulatory Visit: Payer: Self-pay | Admitting: Family Medicine

## 2015-01-31 ENCOUNTER — Ambulatory Visit (HOSPITAL_BASED_OUTPATIENT_CLINIC_OR_DEPARTMENT_OTHER)
Admission: RE | Admit: 2015-01-31 | Discharge: 2015-01-31 | Disposition: A | Discharge status: Home / Self Care | Payer: Commercial Managed Care - HMO | Source: Ambulatory Visit | Attending: Family Medicine | Admitting: Family Medicine

## 2015-01-31 DIAGNOSIS — E049 Nontoxic goiter, unspecified: Secondary | ICD-10-CM | POA: Diagnosis present

## 2015-01-31 DIAGNOSIS — E042 Nontoxic multinodular goiter: Secondary | ICD-10-CM

## 2015-01-31 DIAGNOSIS — E010 Iodine-deficiency related diffuse (endemic) goiter: Secondary | ICD-10-CM

## 2015-02-15 ENCOUNTER — Other Ambulatory Visit: Payer: Self-pay | Admitting: Otolaryngology

## 2015-02-15 DIAGNOSIS — E041 Nontoxic single thyroid nodule: Secondary | ICD-10-CM

## 2015-04-06 ENCOUNTER — Ambulatory Visit
Admission: RE | Admit: 2015-04-06 | Discharge: 2015-04-06 | Disposition: A | Discharge status: Home / Self Care | Payer: Commercial Managed Care - HMO | Source: Ambulatory Visit | Attending: Otolaryngology | Admitting: Otolaryngology

## 2015-04-06 ENCOUNTER — Other Ambulatory Visit (HOSPITAL_COMMUNITY)
Admission: RE | Admit: 2015-04-06 | Discharge: 2015-04-06 | Disposition: A | Discharge status: Home / Self Care | Payer: Commercial Managed Care - HMO | Source: Ambulatory Visit | Attending: Radiology | Admitting: Radiology

## 2015-04-06 DIAGNOSIS — E041 Nontoxic single thyroid nodule: Secondary | ICD-10-CM | POA: Diagnosis present

## 2015-09-13 ENCOUNTER — Other Ambulatory Visit: Payer: Self-pay | Admitting: Family Medicine

## 2015-09-13 DIAGNOSIS — Z1231 Encounter for screening mammogram for malignant neoplasm of breast: Secondary | ICD-10-CM

## 2015-09-26 ENCOUNTER — Ambulatory Visit
Admission: RE | Admit: 2015-09-26 | Discharge: 2015-09-26 | Disposition: A | Discharge status: Home / Self Care | Payer: Commercial Managed Care - HMO | Source: Ambulatory Visit | Attending: Family Medicine | Admitting: Family Medicine

## 2015-09-26 DIAGNOSIS — Z1231 Encounter for screening mammogram for malignant neoplasm of breast: Secondary | ICD-10-CM

## 2015-12-05 ENCOUNTER — Telehealth: Payer: Self-pay | Admitting: Family Medicine

## 2015-12-05 ENCOUNTER — Encounter: Payer: Self-pay | Admitting: Family Medicine

## 2015-12-05 NOTE — Telephone Encounter (Signed)
Patient has appt scheduled for 01/30/16 for her CPE she would like to know if she can get her fasting labs done the week before. Please advise.   Phone: [phone]

## 2015-12-05 NOTE — Telephone Encounter (Signed)
Mailed letter and sent patient a message via My Chart informing that Dr. Carollee Herter will be out of the office on January 22, 2016 and we need to reschedule appointment. Appointment cancelled.

## 2015-12-06 ENCOUNTER — Other Ambulatory Visit: Payer: Self-pay | Admitting: Family Medicine

## 2015-12-06 DIAGNOSIS — Z0000 Encounter for general adult medical examination without abnormal findings: Secondary | ICD-10-CM

## 2015-12-06 DIAGNOSIS — E785 Hyperlipidemia, unspecified: Secondary | ICD-10-CM

## 2015-12-06 NOTE — Telephone Encounter (Signed)
Orders are in

## 2015-12-07 NOTE — Telephone Encounter (Signed)
Called and left a message for call back  

## 2015-12-08 NOTE — Telephone Encounter (Signed)
Called and left a detailed message making patient aware that labs have been ordered and to call our office back to schedule a lab appt.

## 2015-12-18 ENCOUNTER — Other Ambulatory Visit: Payer: Self-pay | Admitting: Family Medicine

## 2015-12-18 DIAGNOSIS — E785 Hyperlipidemia, unspecified: Secondary | ICD-10-CM

## 2015-12-18 DIAGNOSIS — I10 Essential (primary) hypertension: Secondary | ICD-10-CM

## 2015-12-18 NOTE — Telephone Encounter (Signed)
Patient scheduled for labs only 01/24/16 (Wed) at [place].

## 2016-01-22 ENCOUNTER — Encounter: Payer: Commercial Managed Care - HMO | Admitting: Family Medicine

## 2016-01-24 ENCOUNTER — Other Ambulatory Visit (INDEPENDENT_AMBULATORY_CARE_PROVIDER_SITE_OTHER): Payer: Commercial Managed Care - HMO

## 2016-01-24 DIAGNOSIS — I10 Essential (primary) hypertension: Secondary | ICD-10-CM | POA: Diagnosis not present

## 2016-01-24 DIAGNOSIS — E785 Hyperlipidemia, unspecified: Secondary | ICD-10-CM | POA: Diagnosis not present

## 2016-01-24 DIAGNOSIS — Z0000 Encounter for general adult medical examination without abnormal findings: Secondary | ICD-10-CM | POA: Diagnosis not present

## 2016-01-24 LAB — CBC WITH DIFFERENTIAL/PLATELET
BASOS ABS: 0 10*3/uL (ref 0.0–0.1)
Basophils Relative: 0.4 % (ref 0.0–3.0)
EOS ABS: 0.1 10*3/uL (ref 0.0–0.7)
Eosinophils Relative: 1.5 % (ref 0.0–5.0)
HCT: 43.5 % (ref 36.0–46.0)
Hemoglobin: 15.1 g/dL — ABNORMAL HIGH (ref 12.0–15.0)
LYMPHS ABS: 2 10*3/uL (ref 0.7–4.0)
Lymphocytes Relative: 35 % (ref 12.0–46.0)
MCHC: 34.7 g/dL (ref 30.0–36.0)
MCV: 90.7 fl (ref 78.0–100.0)
Monocytes Absolute: 0.4 10*3/uL (ref 0.1–1.0)
Monocytes Relative: 7.3 % (ref 3.0–12.0)
NEUTROS ABS: 3.2 10*3/uL (ref 1.4–7.7)
NEUTROS PCT: 55.8 % (ref 43.0–77.0)
PLATELETS: 205 10*3/uL (ref 150.0–400.0)
RBC: 4.8 Mil/uL (ref 3.87–5.11)
RDW: 12.7 % (ref 11.5–15.5)
WBC: 5.8 10*3/uL (ref 4.0–10.5)

## 2016-01-24 LAB — LDL CHOLESTEROL, DIRECT: LDL DIRECT: 160 mg/dL

## 2016-01-24 LAB — POCT URINALYSIS DIPSTICK
Bilirubin, UA: NEGATIVE
Glucose, UA: NEGATIVE
Ketones, UA: NEGATIVE
Leukocytes, UA: NEGATIVE
NITRITE UA: NEGATIVE
PH UA: 6
Protein, UA: NEGATIVE
RBC UA: NEGATIVE
Spec Grav, UA: 1.025
UROBILINOGEN UA: 0.2

## 2016-01-24 LAB — LIPID PANEL
CHOL/HDL RATIO: 6
CHOLESTEROL: 269 mg/dL — AB (ref 0–200)
HDL: 47.7 mg/dL (ref >39.00)
NonHDL: 220.98
TRIGLYCERIDES: 303 mg/dL — AB (ref 0.0–149.0)
VLDL: 60.6 mg/dL — AB (ref 0.0–40.0)

## 2016-01-24 LAB — COMPREHENSIVE METABOLIC PANEL
ALBUMIN: 4.6 g/dL (ref 3.5–5.2)
ALT: 20 U/L (ref 0–35)
AST: 20 U/L (ref 0–37)
Alkaline Phosphatase: 48 U/L (ref 39–117)
BUN: 17 mg/dL (ref 6–23)
CHLORIDE: 103 meq/L (ref 96–112)
CO2: 27 mEq/L (ref 19–32)
Calcium: 9.7 mg/dL (ref 8.4–10.5)
Creatinine, Ser: 0.84 mg/dL (ref 0.40–1.20)
GFR: 73.51 mL/min (ref >60.00)
Glucose, Bld: 102 mg/dL — ABNORMAL HIGH (ref 70–99)
POTASSIUM: 4.5 meq/L (ref 3.5–5.1)
SODIUM: 139 meq/L (ref 135–145)
Total Bilirubin: 0.6 mg/dL (ref 0.2–1.2)
Total Protein: 7.4 g/dL (ref 6.0–8.3)

## 2016-01-24 LAB — TSH: TSH: 0.9 u[IU]/mL (ref 0.35–4.50)

## 2016-01-30 ENCOUNTER — Encounter: Payer: Self-pay | Admitting: Family Medicine

## 2016-01-30 ENCOUNTER — Ambulatory Visit (INDEPENDENT_AMBULATORY_CARE_PROVIDER_SITE_OTHER): Payer: Commercial Managed Care - HMO | Admitting: Family Medicine

## 2016-01-30 VITALS — BP 125/84 | HR 90 | Temp 97.7°F | Resp 16 | Ht 65.5 in | Wt 171.0 lb

## 2016-01-30 DIAGNOSIS — E785 Hyperlipidemia, unspecified: Secondary | ICD-10-CM

## 2016-01-30 DIAGNOSIS — E041 Nontoxic single thyroid nodule: Secondary | ICD-10-CM

## 2016-01-30 DIAGNOSIS — Z0000 Encounter for general adult medical examination without abnormal findings: Secondary | ICD-10-CM | POA: Diagnosis not present

## 2016-01-30 DIAGNOSIS — I10 Essential (primary) hypertension: Secondary | ICD-10-CM | POA: Diagnosis not present

## 2016-01-30 MED ORDER — LISINOPRIL 20 MG PO TABS: 20.0000 mg | ORAL_TABLET | Freq: Every day | ORAL | 3 refills | Status: DC

## 2016-01-30 MED ORDER — SIMVASTATIN 40 MG PO TABS: 40.0000 mg | ORAL_TABLET | Freq: Every day | ORAL | 1 refills | Status: DC

## 2016-01-30 NOTE — Progress Notes (Signed)
Pre visit review using our clinic review tool, if applicable. No additional management support is needed unless otherwise documented below in the visit note. 

## 2016-01-30 NOTE — Patient Instructions (Signed)

## 2016-01-30 NOTE — Progress Notes (Signed)
Subjective:     Sarah Zuniga is a 60 y.o. female and is here for a comprehensive physical exam. The patient reports no problems.  Social History   Social History  . Marital status: Divorced    Spouse name: N/A  . Number of children: 0  . Years of education: college 3   Occupational History  . [employer] Advertising account executive   . [employer]    .  [employer]   Social History Main Topics  . Smoking status: Never Smoker  . Smokeless tobacco: Never Used  . Alcohol use Yes     Comment: wine weekly  . Drug use: No  . Sexual activity: Not Currently    Partners: Male   Other Topics Concern  . Not on file   Social History Narrative   Exercise-- 4x a week   Health Maintenance  Topic Date Due  . ZOSTAVAX  01/25/2016  . COLONOSCOPY  05/04/2016 (Originally 12/21/2014)  . MAMMOGRAM  09/25/2017  . PAP SMEAR  01/18/2018  . TETANUS/TDAP  01/18/2025  . INFLUENZA VACCINE  Completed  . Hepatitis C Screening  Completed  . HIV Screening  Completed    The following portions of the patient's history were reviewed and updated as appropriate: She  has a past medical history of Cataract; Diplopia (04/28/2013); Hyperlipidemia; Hypertension; and Pigment dispersion syndrome of both eyes. She  does not have any pertinent problems on file. She  has a past surgical history that includes Tonsillectomy; Wisdom tooth extraction; Eye surgery (Left, 12/08/2014); and Cataract extraction (Right). Her family history includes Cancer in her father; Coronary artery disease in her other; Diabetes in her mother; Hyperlipidemia in her father, mother, and sister; Hypertension in her father, mother, and sister;  Kidney disease in her mother; Stroke in her mother. She  reports that she has never smoked. She has never used smokeless tobacco. She reports that she drinks alcohol. She reports that she does not use drugs. She has a current medication list which includes the following prescription(s): cetirizine, vitamin d3, b-12, lisinopril, magnesium, multivitamin, OVER THE COUNTER MEDICATION, probiotic product, selenium, and simvastatin. Current Outpatient Prescriptions on File Prior to Visit  Medication Sig Dispense Refill  . cetirizine (KLS ALLER-TEC) 10 MG tablet Take 10 mg by mouth daily.      Marland Kitchen lisinopril (PRINIVIL,ZESTRIL) 20 MG tablet Take 1 tablet (20 mg total) by mouth daily. 90 tablet 3  . Multiple Vitamin (MULTIVITAMIN) tablet Take 1 tablet by mouth daily.      . simvastatin (ZOCOR) 40 MG tablet Take 1 tablet (40 mg total)  by mouth at bedtime. (Patient not taking: Reported on 01/30/2016) 30 tablet 2   No current facility-administered medications on file prior to visit.    She has No Known Allergies..  Review of Systems Review of Systems  Constitutional: Negative for activity change, appetite change and fatigue.  HENT: Negative for hearing loss, congestion, tinnitus and ear discharge.  dentist q1m Eyes: Negative for visual disturbance (see optho q1y  Respiratory: Negative for cough, chest tightness and shortness of breath.   Cardiovascular: Negative for chest pain, palpitations and leg swelling.  Gastrointestinal: Negative for abdominal pain, diarrhea, constipation and abdominal distention.  Genitourinary: Negative for urgency, frequency, decreased urine volume and difficulty urinating.  Musculoskeletal: Negative for back pain, arthralgias and gait problem.  Skin: Negative for color change, pallor and rash.  Neurological: Negative for dizziness, light-headedness, numbness and headaches.  Hematological: Negative for adenopathy. Does not bruise/bleed easily.  Psychiatric/Behavioral: Negative  for suicidal ideas, confusion, sleep disturbance, self-injury, dysphoric mood, decreased concentration and agitation.         Objective:    BP 125/84 (BP Location: Left Arm, Cuff Size: Normal)   Pulse 90   Temp 97.7 F (36.5 C) (Oral)   Resp 16   Ht 5' 5.5" (1.664 m)   Wt 171 lb (77.6 kg)   SpO2 99%   BMI 28.02 kg/m  General appearance: alert, cooperative, appears stated age and no distress Head: Normocephalic, without obvious abnormality, atraumatic Eyes: per opth Ears: normal TM's and external ear canals both ears Nose: Nares normal. Septum midline. Mucosa normal. No drainage or sinus tenderness. Throat: lips, mucosa, and tongue normal; teeth and gums normal Neck: no adenopathy, no carotid bruit, no JVD, supple, symmetrical, trachea midline and thyroid not enlarged, symmetric, no tenderness/mass/nodules Back: symmetric, no curvature. ROM normal. No CVA tenderness. Lungs: clear to auscultation bilaterally Breasts: normal appearance, no masses or tenderness Heart: regular rate and rhythm, S1, S2 normal, no murmur, click, rub or gallop Abdomen: soft, non-tender; bowel sounds normal; no masses,  no organomegaly Pelvic: deferred Extremities: extremities normal, atraumatic, no cyanosis or edema Pulses: 2+ and symmetric Skin: Skin color, texture, turgor normal. No rashes or lesions Lymph nodes: Cervical, supraclavicular, and axillary nodes normal. Neurologic: Alert and oriented X 3, normal strength and tone. Normal symmetric reflexes. Normal coordination and gait    Assessment:    Healthy female exam.      Plan:  ghm utd Check labs   See After Visit Summary for Counseling Recommendations    1. Preventative health care ghm utd Labs reviewed with pt See AVS  2. Thyroid nodule F/u ent   3. Essential hypertension Stable Refill meds - lisinopril (PRINIVIL,ZESTRIL) 20 MG tablet; Take 1 tablet (20 mg total) by mouth daily.  Dispense: 90 tablet; Refill: 3  4.  Hyperlipidemia LDL goal <100 Pt restarted meds Recheck labs 3 months - simvastatin (ZOCOR) 40 MG tablet; Take 1 tablet (40 mg total) by mouth at bedtime.  Dispense: 90 tablet; Refill: 1

## 2016-03-12 DIAGNOSIS — K219 Gastro-esophageal reflux disease without esophagitis: Secondary | ICD-10-CM | POA: Diagnosis not present

## 2016-03-12 DIAGNOSIS — E041 Nontoxic single thyroid nodule: Secondary | ICD-10-CM | POA: Diagnosis not present

## 2016-06-06 ENCOUNTER — Telehealth: Payer: Self-pay | Admitting: Family Medicine

## 2016-06-06 NOTE — Telephone Encounter (Signed)
Caller name: Relationship to patient: Self Can be reached: [phone]  Pharmacy:  [facility] [place], [place] [phone] (Phone) [phone] (Fax)     Reason for call: 90 day Refill lisinopril (PRINIVIL,ZESTRIL) 20 MG tablet [[id number]]

## 2016-06-07 NOTE — Telephone Encounter (Signed)
Pt returned call. Informed her of message. [facility] will call [facility]. Pt expresses understanding.

## 2016-06-07 NOTE — Telephone Encounter (Signed)
Left message on phone that [facility] and get [facility] to [facility] to transfer she has enough refills til December

## 2016-07-10 DIAGNOSIS — L820 Inflamed seborrheic keratosis: Secondary | ICD-10-CM | POA: Diagnosis not present

## 2016-07-10 DIAGNOSIS — L908 Other atrophic disorders of skin: Secondary | ICD-10-CM | POA: Diagnosis not present

## 2016-08-29 ENCOUNTER — Telehealth: Payer: Self-pay | Admitting: Family Medicine

## 2016-08-29 DIAGNOSIS — Z13820 Encounter for screening for osteoporosis: Secondary | ICD-10-CM

## 2016-08-29 NOTE — Telephone Encounter (Signed)
? 

## 2016-08-29 NOTE — Telephone Encounter (Signed)
Entered to be done at the breast center. Patient notified.

## 2016-08-29 NOTE — Telephone Encounter (Signed)
Order entered

## 2016-08-29 NOTE — Telephone Encounter (Signed)
Caller name:Paulette Kopkins Relationship to patient: Can be reached:[phone] Pharmacy:  Reason for call:needs order in system for Dexa Scan, requesting to have done at breast center.

## 2016-08-30 ENCOUNTER — Other Ambulatory Visit: Payer: Self-pay | Admitting: Family Medicine

## 2016-08-30 DIAGNOSIS — M8580 Other specified disorders of bone density and structure, unspecified site: Secondary | ICD-10-CM

## 2016-09-02 ENCOUNTER — Other Ambulatory Visit: Payer: Self-pay | Admitting: Family Medicine

## 2016-09-02 DIAGNOSIS — Z1231 Encounter for screening mammogram for malignant neoplasm of breast: Secondary | ICD-10-CM

## 2016-09-26 ENCOUNTER — Ambulatory Visit
Admission: RE | Admit: 2016-09-26 | Discharge: 2016-09-26 | Disposition: A | Discharge status: Home / Self Care | Payer: 59 | Source: Ambulatory Visit | Attending: Family Medicine | Admitting: Family Medicine

## 2016-09-26 DIAGNOSIS — Z1231 Encounter for screening mammogram for malignant neoplasm of breast: Secondary | ICD-10-CM | POA: Diagnosis not present

## 2016-09-26 DIAGNOSIS — M85851 Other specified disorders of bone density and structure, right thigh: Secondary | ICD-10-CM | POA: Diagnosis not present

## 2016-09-26 DIAGNOSIS — M8580 Other specified disorders of bone density and structure, unspecified site: Secondary | ICD-10-CM

## 2016-09-26 DIAGNOSIS — Z780 Asymptomatic menopausal state: Secondary | ICD-10-CM | POA: Diagnosis not present

## 2016-10-31 ENCOUNTER — Other Ambulatory Visit: Payer: Self-pay | Admitting: Family Medicine

## 2016-10-31 DIAGNOSIS — E785 Hyperlipidemia, unspecified: Secondary | ICD-10-CM

## 2016-11-03 DIAGNOSIS — Z23 Encounter for immunization: Secondary | ICD-10-CM | POA: Diagnosis not present

## 2017-01-24 DIAGNOSIS — Z961 Presence of intraocular lens: Secondary | ICD-10-CM | POA: Diagnosis not present

## 2017-01-24 DIAGNOSIS — H21233 Degeneration of iris (pigmentary), bilateral: Secondary | ICD-10-CM | POA: Diagnosis not present

## 2017-02-25 ENCOUNTER — Other Ambulatory Visit: Payer: Self-pay | Admitting: Family Medicine

## 2017-02-25 DIAGNOSIS — I10 Essential (primary) hypertension: Secondary | ICD-10-CM

## 2017-02-27 ENCOUNTER — Encounter: Payer: Self-pay | Admitting: Family Medicine

## 2017-02-27 ENCOUNTER — Other Ambulatory Visit (HOSPITAL_COMMUNITY)
Admission: RE | Admit: 2017-02-27 | Discharge: 2017-02-27 | Disposition: A | Discharge status: Home / Self Care | Payer: 59 | Source: Ambulatory Visit | Attending: Family Medicine | Admitting: Family Medicine

## 2017-02-27 ENCOUNTER — Ambulatory Visit (INDEPENDENT_AMBULATORY_CARE_PROVIDER_SITE_OTHER): Payer: 59 | Admitting: Family Medicine

## 2017-02-27 VITALS — BP 118/78 | HR 80 | Temp 98.2°F | Resp 16 | Ht 66.0 in | Wt 169.4 lb

## 2017-02-27 DIAGNOSIS — E785 Hyperlipidemia, unspecified: Secondary | ICD-10-CM | POA: Diagnosis not present

## 2017-02-27 DIAGNOSIS — Z0000 Encounter for general adult medical examination without abnormal findings: Secondary | ICD-10-CM

## 2017-02-27 DIAGNOSIS — I10 Essential (primary) hypertension: Secondary | ICD-10-CM

## 2017-02-27 DIAGNOSIS — K635 Polyp of colon: Secondary | ICD-10-CM | POA: Diagnosis not present

## 2017-02-27 DIAGNOSIS — E041 Nontoxic single thyroid nodule: Secondary | ICD-10-CM | POA: Diagnosis not present

## 2017-02-27 DIAGNOSIS — E039 Hypothyroidism, unspecified: Secondary | ICD-10-CM | POA: Insufficient documentation

## 2017-02-27 LAB — POC URINALSYSI DIPSTICK (AUTOMATED)
BILIRUBIN UA: NEGATIVE
Glucose, UA: NEGATIVE
Ketones, UA: NEGATIVE
Leukocytes, UA: NEGATIVE
Nitrite, UA: NEGATIVE
PH UA: 6 (ref 5.0–8.0)
Protein, UA: NEGATIVE
RBC UA: NEGATIVE
Spec Grav, UA: 1.015 (ref 1.010–1.025)
UROBILINOGEN UA: 0.2 U/dL

## 2017-02-27 MED ORDER — LISINOPRIL 20 MG PO TABS: 20.0000 mg | ORAL_TABLET | Freq: Every day | ORAL | 2 refills | Status: DC

## 2017-02-27 NOTE — Progress Notes (Signed)
Subjective:     Sarah Zuniga is a 62 y.o. female and is here for a comprehensive physical exam. The patient reports no problems.  Social History   Socioeconomic History  . Marital status: Divorced    Spouse name: Not on file  . Number of children: 0  . Years of education: college 3  . Highest education level: Not on file  Social Needs  . Financial resource strain: Not on file  . Food insecurity - worry: Not on file  . Food insecurity - inability: Not on file  . Transportation needs - medical: Not on file  . Transportation needs - non-medical: Not on file  Occupational History  . Occupation: Publishing rights manager  . Occupation: [employer] officer: [employer]  Tobacco Use  . Smoking status: Never Smoker  . Smokeless tobacco: Never Used  Substance and Sexual Activity  . Alcohol use: Yes    Comment: wine weekly  . Drug use: No  . Sexual activity: Not Currently    Partners: Male  Other Topics Concern  . Not on file  Social History Narrative   Exercise-- 4x a week   Health Maintenance  Topic Date Due  . COLONOSCOPY  12/21/2014  . MAMMOGRAM  09/26/2017  . PAP SMEAR  01/18/2018  . TETANUS/TDAP  01/18/2025  . INFLUENZA VACCINE  Completed  . Hepatitis C Screening  Completed  . HIV Screening  Completed    The following portions of the patient's history were reviewed and updated as appropriate:  She  has a past medical history of Cataract, Diplopia (04/28/2013), Hyperlipidemia, Hypertension, and Pigment dispersion syndrome of both eyes. She does not have any pertinent problems on file. She  has a past surgical history that includes Tonsillectomy; Wisdom tooth extraction; Eye surgery (Left, 12/08/2014); and Cataract extraction (Right). Her family history includes Breast cancer in her paternal grandmother; Cancer in her father; Coronary artery disease in her other; Diabetes in her mother; Hyperlipidemia in her father, mother, and sister; Hypertension in her father,  mother, and sister; Kidney disease in her mother; Stroke in her mother. She  reports that  has never smoked. she has never used smokeless tobacco. She reports that she drinks alcohol. She reports that she does not use drugs. She has a current medication list which includes the following prescription(s): cetirizine, vitamin d3, b-12, lisinopril, magnesium, multivitamin, OVER THE COUNTER MEDICATION, probiotic product, selenium, and simvastatin. Current Outpatient Medications on File Prior to Visit  Medication Sig Dispense Refill  . cetirizine (KLS ALLER-TEC) 10 MG tablet Take 10 mg by mouth daily.      . Cholecalciferol (VITAMIN D3) 2000 units TABS Take 1 tablet by mouth daily.    . Cyanocobalamin (B-12) 5000 MCG SUBL Place 5,000 mcg under the tongue daily.    Marland Kitchen lisinopril (PRINIVIL,ZESTRIL) 20 MG tablet TAKE 1 TABLET EVERY DAY 90 tablet 2  . Magnesium 400 MG CAPS Take 1 capsule by mouth daily.    . Multiple Vitamin (MULTIVITAMIN) tablet Take 1 tablet by mouth daily.      Marland Kitchen OVER THE COUNTER MEDICATION Dulse (iodine granules)  Take 1tsp qd    . Probiotic Product (PROBIOTIC DAILY PO) Take 1 tablet by mouth daily.    . Selenium 100 MCG CAPS Take 1 capsule by mouth daily.    . simvastatin (ZOCOR) 40 MG tablet TAKE 1 TABLET BY MOUTH AT BEDTIME 30 tablet 0   No current facility-administered medications on file prior to visit.  She has No Known Allergies..  Review of Systems Review of Systems  Constitutional: Negative for activity change, appetite change and fatigue.  HENT: Negative for hearing loss, congestion, tinnitus and ear discharge.  dentist q74m Eyes: Negative for visual disturbance (see optho q1y -- vision corrected to 20/20 with glasses).  Respiratory: Negative for cough, chest tightness and shortness of breath.   Cardiovascular: Negative for chest pain, palpitations and leg swelling.  Gastrointestinal: Negative for abdominal pain, diarrhea, constipation and abdominal distention.   Genitourinary: Negative for urgency, frequency, decreased urine volume and difficulty urinating.  Musculoskeletal: Negative for back pain, arthralgias and gait problem.  Skin: Negative for color change, pallor and rash.  Neurological: Negative for dizziness, light-headedness, numbness and headaches.  Hematological: Negative for adenopathy. Does not bruise/bleed easily.  Psychiatric/Behavioral: Negative for suicidal ideas, confusion, sleep disturbance, self-injury, dysphoric mood, decreased concentration and agitation.       Objective:    BP 118/78 (BP Location: Right Arm, Cuff Size: Normal)   Pulse 80   Temp 98.2 F (36.8 C) (Oral)   Resp 16   Ht 5\' 6"  (1.676 m)   Wt 169 lb 6.4 oz (76.8 kg)   SpO2 98%   BMI 27.34 kg/m  General appearance: alert, cooperative, appears stated age and no distress Head: Normocephalic, without obvious abnormality, atraumatic Eyes: conjunctivae/corneas clear. PERRL, EOM's intact. Fundi benign. Ears: normal TM's and external ear canals both ears Nose: Nares normal. Septum midline. Mucosa normal. No drainage or sinus tenderness. Throat: lips, mucosa, and tongue normal; teeth and gums normal Neck: no adenopathy, no carotid bruit, no JVD, supple, symmetrical, trachea midline and thyroid not enlarged, symmetric, no tenderness/mass/nodules Back: symmetric, no curvature. ROM normal. No CVA tenderness. Lungs: clear to auscultation bilaterally Breasts: normal appearance, no masses or tenderness Heart: regular rate and rhythm, S1, S2 normal, no murmur, click, rub or gallop Abdomen: soft, non-tender; bowel sounds normal; no masses,  no organomegaly Pelvic: cervix normal in appearance, external genitalia normal, no adnexal masses or tenderness, no cervical motion tenderness, rectovaginal septum normal, uterus normal size, shape, and consistency, vagina normal without discharge and pap done, rectal heme neg brown stool Extremities: extremities normal, atraumatic,  no cyanosis or edema Pulses: 2+ and symmetric Skin: Skin color, texture, turgor normal. No rashes or lesions Lymph nodes: Cervical, supraclavicular, and axillary nodes normal. Neurologic: Alert and oriented X 3, normal strength and tone. Normal symmetric reflexes. Normal coordination and gait    Assessment:    Healthy female exam.      Plan:    ghm utd Check labs See After Visit Summary for Counseling Recommendations    1. Preventative health care See above - POCT Urinalysis Dipstick (Automated) - CBC with Differential/Platelet - Cytology - PAP  2. Essential hypertension Well controlled, no changes to meds. Encouraged heart healthy diet such as the DASH diet and exercise as tolerated.   - POCT Urinalysis Dipstick (Automated) - CBC with Differential/Platelet - Comprehensive metabolic panel - Lipid panel - TSH - Ambulatory referral to Endocrinology - lisinopril (PRINIVIL,ZESTRIL) 20 MG tablet; Take 1 tablet (20 mg total) by mouth daily.  Dispense: 90 tablet; Refill: 2  3. Hyperlipidemia LDL goal <100 Tolerating statin, encouraged heart healthy diet, avoid trans fats, minimize simple carbs and saturated fats. Increase exercise as tolerated - POCT Urinalysis Dipstick (Automated) - CBC with Differential/Platelet - Comprehensive metabolic panel - Lipid panel - TSH - Ambulatory referral to Endocrinology  4. Thyroid nodule Pt requesting referral to endo  5. Polyp of colon,  unspecified part of colon, unspecified type  - Ambulatory referral to Gastroenterology

## 2017-02-27 NOTE — Patient Instructions (Signed)
Preventive Care 40-64 Years, Female Preventive care refers to lifestyle choices and visits with your health care provider that can promote health and wellness. What does preventive care include?  A yearly physical exam. This is also called an annual well check.  Dental exams once or twice a year.  Routine eye exams. Ask your health care provider how often you should have your eyes checked.  Personal lifestyle choices, including: ? Daily care of your teeth and gums. ? Regular physical activity. ? Eating a healthy diet. ? Avoiding tobacco and drug use. ? Limiting alcohol use. ? Practicing safe sex. ? Taking low-dose aspirin daily starting at age 58. ? Taking vitamin and mineral supplements as recommended by your health care provider. What happens during an annual well check? The services and screenings done by your health care provider during your annual well check will depend on your age, overall health, lifestyle risk factors, and family history of disease. Counseling Your health care provider may ask you questions about your:  Alcohol use.  Tobacco use.  Drug use.  Emotional well-being.  Home and relationship well-being.  Sexual activity.  Eating habits.  Work and work Statistician.  Method of birth control.  Menstrual cycle.  Pregnancy history.  Screening You may have the following tests or measurements:  Height, weight, and BMI.  Blood pressure.  Lipid and cholesterol levels. These may be checked every 5 years, or more frequently if you are over 81 years old.  Skin check.  Lung cancer screening. You may have this screening every year starting at age 78 if you have a 30-pack-year history of smoking and currently smoke or have quit within the past 15 years.  Fecal occult blood test (FOBT) of the stool. You may have this test every year starting at age 65.  Flexible sigmoidoscopy or colonoscopy. You may have a sigmoidoscopy every 5 years or a colonoscopy  every 10 years starting at age 30.  Hepatitis C blood test.  Hepatitis B blood test.  Sexually transmitted disease (STD) testing.  Diabetes screening. This is done by checking your blood sugar (glucose) after you have not eaten for a while (fasting). You may have this done every 1-3 years.  Mammogram. This may be done every 1-2 years. Talk to your health care provider about when you should start having regular mammograms. This may depend on whether you have a family history of breast cancer.  BRCA-related cancer screening. This may be done if you have a family history of breast, ovarian, tubal, or peritoneal cancers.  Pelvic exam and Pap test. This may be done every 3 years starting at age 80. Starting at age 36, this may be done every 5 years if you have a Pap test in combination with an HPV test.  Bone density scan. This is done to screen for osteoporosis. You may have this scan if you are at high risk for osteoporosis.  Discuss your test results, treatment options, and if necessary, the need for more tests with your health care provider. Vaccines Your health care provider may recommend certain vaccines, such as:  Influenza vaccine. This is recommended every year.  Tetanus, diphtheria, and acellular pertussis (Tdap, Td) vaccine. You may need a Td booster every 10 years.  Varicella vaccine. You may need this if you have not been vaccinated.  Zoster vaccine. You may need this after age 5.  Measles, mumps, and rubella (MMR) vaccine. You may need at least one dose of MMR if you were born in  1957 or later. You may also need a second dose.  Pneumococcal 13-valent conjugate (PCV13) vaccine. You may need this if you have certain conditions and were not previously vaccinated.  Pneumococcal polysaccharide (PPSV23) vaccine. You may need one or two doses if you smoke cigarettes or if you have certain conditions.  Meningococcal vaccine. You may need this if you have certain  conditions.  Hepatitis A vaccine. You may need this if you have certain conditions or if you travel or work in places where you may be exposed to hepatitis A.  Hepatitis B vaccine. You may need this if you have certain conditions or if you travel or work in places where you may be exposed to hepatitis B.  Haemophilus influenzae type b (Hib) vaccine. You may need this if you have certain conditions.  Talk to your health care provider about which screenings and vaccines you need and how often you need them. This information is not intended to replace advice given to you by your health care provider. Make sure you discuss any questions you have with your health care provider. Document Released: 02/17/2015 Document Revised: 10/11/2015 Document Reviewed: 11/22/2014 Elsevier Interactive Patient Education  2018 [employer]  

## 2017-02-27 NOTE — Assessment & Plan Note (Signed)
Well controlled, no changes to meds. Encouraged heart healthy diet such as the DASH diet and exercise as tolerated.  °

## 2017-02-27 NOTE — Assessment & Plan Note (Signed)
Pt to see endo

## 2017-02-28 LAB — CBC WITH DIFFERENTIAL/PLATELET
Basophils Absolute: 0.1 10*3/uL (ref 0.0–0.1)
Basophils Relative: 1.2 % (ref 0.0–3.0)
EOS PCT: 1.6 % (ref 0.0–5.0)
Eosinophils Absolute: 0.1 10*3/uL (ref 0.0–0.7)
HEMATOCRIT: 44.2 % (ref 36.0–46.0)
Hemoglobin: 15 g/dL (ref 12.0–15.0)
Lymphocytes Relative: 40.5 % (ref 12.0–46.0)
Lymphs Abs: 2.4 10*3/uL (ref 0.7–4.0)
MCHC: 34 g/dL (ref 30.0–36.0)
MCV: 92.8 fl (ref 78.0–100.0)
Monocytes Absolute: 0.3 10*3/uL (ref 0.1–1.0)
Monocytes Relative: 5.8 % (ref 3.0–12.0)
Neutro Abs: 3.1 10*3/uL (ref 1.4–7.7)
Neutrophils Relative %: 50.9 % (ref 43.0–77.0)
Platelets: 206 10*3/uL (ref 150.0–400.0)
RBC: 4.76 Mil/uL (ref 3.87–5.11)
RDW: 12.7 % (ref 11.5–15.5)
WBC: 6 10*3/uL (ref 4.0–10.5)

## 2017-02-28 LAB — COMPREHENSIVE METABOLIC PANEL
ALBUMIN: 4.6 g/dL (ref 3.5–5.2)
ALK PHOS: 46 U/L (ref 39–117)
ALT: 24 U/L (ref 0–35)
AST: 24 U/L (ref 0–37)
BUN: 12 mg/dL (ref 6–23)
CALCIUM: 9.6 mg/dL (ref 8.4–10.5)
CO2: 23 mEq/L (ref 19–32)
Chloride: 103 mEq/L (ref 96–112)
Creatinine, Ser: 0.78 mg/dL (ref 0.40–1.20)
GFR: 79.78 mL/min (ref >60.00)
Glucose, Bld: 76 mg/dL (ref 70–99)
POTASSIUM: 3.7 meq/L (ref 3.5–5.1)
Sodium: 139 mEq/L (ref 135–145)
TOTAL PROTEIN: 7.5 g/dL (ref 6.0–8.3)
Total Bilirubin: 0.7 mg/dL (ref 0.2–1.2)

## 2017-02-28 LAB — LIPID PANEL
CHOLESTEROL: 194 mg/dL (ref 0–200)
HDL: 49.4 mg/dL (ref >39.00)
LDL Cholesterol: 114 mg/dL — ABNORMAL HIGH (ref 0–99)
NonHDL: 145.07
Total CHOL/HDL Ratio: 4
Triglycerides: 153 mg/dL — ABNORMAL HIGH (ref 0.0–149.0)
VLDL: 30.6 mg/dL (ref 0.0–40.0)

## 2017-02-28 LAB — TSH: TSH: 0.75 u[IU]/mL (ref 0.35–4.50)

## 2017-03-04 LAB — CYTOLOGY - PAP
Diagnosis: NEGATIVE
HPV (WINDOPATH): NOT DETECTED

## 2017-03-11 ENCOUNTER — Other Ambulatory Visit: Payer: Self-pay | Admitting: *Deleted

## 2017-03-11 ENCOUNTER — Encounter: Payer: Self-pay | Admitting: Family Medicine

## 2017-03-11 ENCOUNTER — Telehealth: Payer: Self-pay | Admitting: Family Medicine

## 2017-03-11 DIAGNOSIS — I10 Essential (primary) hypertension: Secondary | ICD-10-CM

## 2017-03-11 DIAGNOSIS — E785 Hyperlipidemia, unspecified: Secondary | ICD-10-CM

## 2017-03-11 MED ORDER — SIMVASTATIN 80 MG PO TABS: 80.0000 mg | ORAL_TABLET | Freq: Every day | ORAL | 2 refills | Status: DC

## 2017-03-11 MED ORDER — SIMVASTATIN 40 MG PO TABS: 40.0000 mg | ORAL_TABLET | Freq: Every day | ORAL | 1 refills | Status: DC

## 2017-03-11 NOTE — Telephone Encounter (Signed)
Patient stated that she does not take her zocor daily, she will take regularly.  Rx sent in for 40mg .

## 2017-03-11 NOTE — Telephone Encounter (Signed)
See phone note

## 2017-03-11 NOTE — Telephone Encounter (Signed)
Copied from [place] [id number]. Topic: Quick Communication - See Telephone Encounter >> Mar 11, 2017  9:38 AM Ahmed Prima L wrote: CRM for notification. See Telephone encounter for:   03/11/17.  Patient wants to know why the dosage was doubled on her simvastatin (ZOCOR) 80 MG tablet? She wants it to stay at 40mg  Call back is [phone]

## 2017-05-27 ENCOUNTER — Ambulatory Visit: Payer: 59 | Admitting: Internal Medicine

## 2017-05-27 ENCOUNTER — Encounter: Payer: Self-pay | Admitting: Internal Medicine

## 2017-05-27 VITALS — BP 116/72 | HR 76 | Ht 66.0 in | Wt 172.0 lb

## 2017-05-27 DIAGNOSIS — E042 Nontoxic multinodular goiter: Secondary | ICD-10-CM | POA: Insufficient documentation

## 2017-05-27 NOTE — Patient Instructions (Signed)
Please stop at the lab.  We will schedule a thyroid U/S downstairs.  Please come back for a follow-up appointment in 1 year.   Thyroid Nodule A thyroid nodule is an isolatedgrowth of thyroid cells that forms a lump in your thyroid gland. The thyroid gland is a butterfly-shaped gland. It is found in the lower front of your neck. This gland sends chemical messengers (hormones) through your blood to all parts of your body. These hormones are important in regulating your body temperature and helping your body to use energy. Thyroid nodules are common. Most are not cancerous (are benign). You may have one nodule or several nodules. Different types of thyroid nodules include:  Nodules that grow and fill with fluid (thyroid cysts).  Nodules that produce too much thyroid hormone (hot nodules or hyperthyroid).  Nodules that produce no thyroid hormone (cold nodules or hypothyroid).  Nodules that form from cancer cells (thyroid cancers).  What are the causes? Usually, the cause of this condition is not known. What increases the risk? Factors that make this condition more likely to develop include:  Increasing age. Thyroid nodules become more common in people who are older than 62 years of age.  Gender. ? Benign thyroid nodules are more common in women. ? Cancerous (malignant) thyroid nodules are more common in men.  A family history that includes: ? Thyroid nodules. ? Pheochromocytoma. ? Thyroid carcinoma. ? Hyperparathyroidism.  Certain kinds of thyroid diseases, such as Hashimoto thyroiditis.  Lack of iodine.  A history of head and neck radiation, such as from X-rays.  What are the signs or symptoms? It is common for this condition to cause no symptoms. If you have symptoms, they may include:  A lump in your lower neck.  Feeling a lump or tickle in your throat.  Pain in your neck, jaw, or ear.  Having trouble swallowing.  Hot nodules may cause symptoms that  include:  Weight loss.  Warm, flushed skin.  Feeling hot.  Feeling nervous.  A racing heartbeat.  Cold nodules may cause symptoms that include:  Weight gain.  Dry skin.  Brittle hair. This may also occur with hair loss.  Feeling cold.  Fatigue.  Thyroid cancer nodules may cause symptoms that include:  Hard nodules that feel stuck to the thyroid gland.  Hoarseness.  Lumps in the glands near your thyroid (lymph nodes).  How is this diagnosed? A thyroid nodule may be felt by your health care provider during a physical exam. This condition may also be diagnosed based on your symptoms. You may also have tests, including:  An ultrasound. This may be done to confirm the diagnosis.  A biopsy. This involves taking a sample from the nodule and looking at it under a microscope to see if the nodule is benign.  Blood tests to make sure that your thyroid is working properly.  Imaging tests such as MRI or CT scan may be done if: ? Your nodule is large. ? Your nodule is blocking your airway. ? Cancer is suspected.  How is this treated? Treatment depends on the cause and size of your nodule or nodules. If the nodule is benign, treatment may not be necessary. Your health care provider may monitor the nodule to see if it goes away without treatment. If the nodule continues to grow, is cancerous, or does not go away:  It may need to be drained with a needle.  It may need to be removed with surgery.  If you have surgery, part  or all of your thyroid gland may need to be removed as well. Follow these instructions at home:  Pay attention to any changes in your nodule.  Take over-the-counter and prescription medicines only as told by your health care provider.  Keep all follow-up visits as told by your health care provider. This is important. Contact a health care provider if:  Your voice changes.  You have trouble swallowing.  You have pain in your neck, ear, or jaw that is  getting worse.  Your nodule gets bigger.  Your nodule starts to make it harder for you to breathe. Get help right away if:  You have a sudden fever.  You feel very weak.  Your muscles look like they are shrinking (muscle wasting).  You have mood swings.  You feel very restless.  You feel confused.  You are seeing or hearing things that other people do not see or hear (having hallucinations).  You feel suddenly nauseous or throw up.  You suddenly have diarrhea.  You have chest pain.  There is a loss of consciousness. This information is not intended to replace advice given to you by your health care provider. Make sure you discuss any questions you have with your health care provider. Document Released: 12/15/2003 Document Revised: 09/24/2015 Document Reviewed: 05/04/2014 Elsevier Interactive Patient Education  2018 [employer].

## 2017-05-27 NOTE — Progress Notes (Addendum)
Patient ID: Sarah Zuniga, female   DOB: 21-Feb-1955, 61 y.o.   MRN: [id number]    HPI  Sarah Zuniga is a 62 y.o.-year-old female, referred by her PCP, Dr. Carollee Herter, for evaluation for thyroid nodules.  She was diagnosed with thyroid nodule in 2016, and at that time she saw ENT (Dr. Wilburn Cornelia).  He ordered a thyroid ultrasound and then biopsied the largest nodule.  Results were benign.  Thyroid U/S (01/31/2015): Entire gland heterogeneous, with increased blood flow; right dominant nodule, 3.5 cm, isoechoic, mixed, with some blood flow, with spongiform appearance per my review of the images.  She has another right mid thyroid nodule, 1.4 cm.  FNA of the right dominant nodule (04/06/2015): Benign  Pt mentions: - feeling nodules in neck - feels the food occasionally gets stuck in her throat (lettuce)  But denies: - hoarseness - dysphagia - choking - SOB with lying down  I reviewed pt's thyroid tests: Lab Results  Component Value Date   TSH 0.75 02/27/2017   TSH 0.90 01/24/2016   TSH 0.60 01/19/2015   TSH 0.877 01/07/2014   TSH 0.80 04/19/2013   TSH 1.02 12/07/2012   TSH 0.36 12/05/2011   TSH 0.52 11/29/2010   TSH 0.97 11/09/2009   TSH 0.63 07/19/2008   FREET4 0.86 12/05/2011   She was taking selenium 100 mcg daily and also 1 teaspoon of Dulse - red algae (iodine) daily. Stopped 6 mo ago.  + FH of thyroid ds. - M, sisters. No FH of thyroid cancer. No h/o radiation tx to head or neck.  No seaweed. No recent contrast studies. No steroid use. No herbal supplements. No Biotin supplements or Hair, Skin and Nails vitamins.  Pt also has a history of an autoimmune ds.  - increased reabsorption of the teeth.  ROS: Constitutional: no weight gain/loss, no fatigue, + hot flashes, + poor sleep Eyes: no blurry vision, no xerophthalmia ENT: no sore throat, + see HPI Cardiovascular: no CP/SOB/palpitations/leg swelling Respiratory: no cough/SOB Gastrointestinal: no  N/V/D/C Musculoskeletal: no muscle/joint aches Skin: no rashes, + hair loss Neurological: no tremors/numbness/tingling/dizziness Psychiatric: no depression/anxiety + Low libido  Past Medical History:  Diagnosis Date  . Cataract    Left eye  . Diplopia 04/28/2013   Monocular , left  . Hyperlipidemia   . Hypertension   . Pigment dispersion syndrome of both eyes    Past Surgical History:  Procedure Laterality Date  . CATARACT EXTRACTION Right   . EYE SURGERY Left 12/08/2014   cataract surgery  . TONSILLECTOMY    . WISDOM TOOTH EXTRACTION     Social History   Socioeconomic History  . Marital status: Divorced    Spouse name: Not on file  . Number of children: 0  . Years of education: college 3  . Highest education level: Not on file  Occupational History  . Occupation: Freight forwarder  Social Needs  Tobacco Use  . Smoking status: Never Smoker  . Smokeless tobacco: Never Used  Substance and Sexual Activity  . Alcohol use: Yes    Comment: Wine, vodka-weekend  . Drug use: No  She exercising: Cardio, weights, 4-5 times a week.  Current Outpatient Medications on File Prior to Visit  Medication Sig Dispense Refill  . cetirizine (KLS ALLER-TEC) 10 MG tablet Take 10 mg by mouth daily.      . Cholecalciferol (VITAMIN D3) 2000 units TABS Take 1 tablet by mouth daily.    . Cyanocobalamin (B-12) 5000 MCG SUBL Place 5,000 mcg under the  tongue daily.    Marland Kitchen lisinopril (PRINIVIL,ZESTRIL) 20 MG tablet Take 1 tablet (20 mg total) by mouth daily. 90 tablet 2  . Magnesium 400 MG CAPS Take 1 capsule by mouth daily.    . Multiple Vitamin (MULTIVITAMIN) tablet Take 1 tablet by mouth daily.      Marland Kitchen OVER THE COUNTER MEDICATION Dulse (iodine granules)  Take 1tsp qd    . Probiotic Product (PROBIOTIC DAILY PO) Take 1 tablet by mouth daily.    . simvastatin (ZOCOR) 40 MG tablet Take 1 tablet (40 mg total) by mouth at bedtime. 90 tablet 1  . Selenium 100 MCG CAPS Take 1 capsule by mouth daily.     No  current facility-administered medications on file prior to visit.    No Known Allergies Family History  Problem Relation Age of Onset  . Diabetes Mother   . Hypertension Mother   . Hyperlipidemia Mother   . Stroke Mother   . Kidney disease Mother   . Cancer Father        lung  . Hyperlipidemia Father   . Hypertension Father   . Hyperlipidemia Sister   . Hypertension Sister   . Breast cancer Paternal Grandmother   . Coronary artery disease Other        1st degree relative female <50.Marland KitchenMarland Kitchenfemale <60    PE: BP 116/72   Pulse 76   Ht 5\' 6"  (1.676 m)   Wt 172 lb (78 kg)   SpO2 97%   BMI 27.76 kg/m  Wt Readings from Last 3 Encounters:  05/27/17 172 lb (78 kg)  02/27/17 169 lb 6.4 oz (76.8 kg)  01/30/16 171 lb (77.6 kg)   Constitutional: overweight, in NAD Eyes: PERRLA, EOMI, no exophthalmos ENT: moist mucous membranes, + R>L thyromegaly, no cervical lymphadenopathy Cardiovascular: RRR, No MRG Respiratory: CTA B Gastrointestinal: abdomen soft, NT, ND, BS+ Musculoskeletal: no deformities, strength intact in all 4;  Skin: moist, warm, no rashes Neurological: no tremor with outstretched hands, DTR normal in all 4  ASSESSMENT: 1. Multiple thyroid nodules  PLAN: 1. Multiple thyroid nodules - I reviewed the images of her thyroid ultrasound along with the patient. I pointed out that the dominant nodule  Is large, this being a risk factor for cancer.  This nodule was biopsied in 2016 with benign results. The nodule is: - not hypoechoic - without microcalcifications - more wide than tall - well delimited from surrounding tissue It has a little bit more blood flow inside, but this is not much different compared to the rest of the thyroid, in which the blood flow is also increased - She has other smaller nodules, without worrisome features. Pt does not have a thyroid cancer family history or a personal history of RxTx to head/neck. All these would favor benignity.  - We discussed  about the fact that thyroid nodules are common, and the fact that she had a prior biopsy that was negative for cancer puts her at very low risk for cancer.  However, this is not 0, so we will continue to follow her yearly or every 2 years and check another ultrasound in another year or 2. - she should let me know if she develops neck compression symptoms, in that case, we might need to do either lobectomy or thyroidectomy - I did explain that, while thyroid surgery is not a complicated one, it still can have side effects and also she might have a risk of ~25% of becoming hypothyroid after hemithyroidectomy.  -  Due to the heterogeneous aspect of the thyroid and increased blood flow, we also discussed about checking thyroid antibodies, as I suspect she has Hashimoto's thyroiditis.  I explained that this is an autoimmune disease and it can run in families.  Selenium may help reduce the antibodies if they are high.  She agrees with having this checked.  I would not repeat a TSH, since this was checked 3 months ago and this was normal.  In fact, TSH levels since 2010 were all normal. - I'll see her back in a year, assuming her thyroid ultrasound is unchanged.  If the nodules have changed, we may need to rebiopsy  Orders Placed This Encounter  Procedures  . Thyroglobulin antibody  . Thyroid peroxidase antibody   Component     Latest Ref Rng & Units 05/27/2017  Thyroglobulin Ab     < or = 1 IU/mL <1  Thyroperoxidase Ab SerPl-aCnc     <9 IU/mL 2   No signs of Hashimoto's thyroiditis.  Thyroid ultrasound: Details   Reading Physician Reading Date Result Priority  Jacqulynn Cadet, MD 06/05/2017     Narrative    CLINICAL DATA: Goiter. 62 year old female with a history of multiple thyroid nodules. Patient previously underwent biopsy of the largest right thyroid nodule on 04/06/2015  Parenchymal Echotexture: Markedly heterogenous Isthmus: 0.6 cm Right lobe: 8.0 x 3.0 x 3.5 cm Left lobe: 5.0 x 1.5  x 1.7 cm _______________________________________________________  The previously biopsied mass occupying the majority of the right inferior gland measures 4.2 x 3.5 x 2.4 cm, slightly enlarged compared to 3.5 x 2.9 x 2.3 cm previously.   There is a 1.6 cm anechoic simple cyst in the right mid gland.   Multiple additional hypoechoic cysts and nodules bilaterally, none of which meet criteria for further evaluation.  IMPRESSION: 1. Slight interval enlargement of the previously biopsied thyroid nodule occupying the majority of the right lower lobe compared to 01/31/2015. Recommend correlation with prior biopsy results. 2. Multiple bilateral thyroid cysts and nodules which do not meet criteria for biopsy or further imaging evaluation.  The above is in keeping with the ACR TI-RADS recommendations - J Am Coll Radiol 2017;14:587-595.   Electronically Signed By: Jacqulynn Cadet M.D. On: 06/05/2017 18:11   The right thyroid nodule is large and has increased from before.  Although my suspicion is low, I would still want to rule out  malignancy .  I would suggest FNA of this nodule.  If this is benign, no further biopsies would be needed, but she may need to have lobectomy if she starts developing neck compression symptoms.  Adequacy Reason Satisfactory For Evaluation. Diagnosis THYROID, FINE NEEDLE ASPIRATION, RT. LOBE, RLP (SPECIMEN 1 OF 1 COLLECTED 07-15-2017) ATYPIA OF UNDETERMINED SIGNIFICANCE OR FOLLICULAR LESION OF UNDETERMINED SIGNIFICANCE (B[place] CATEGORY III). MATERIAL FOR AFIRMA TESTING WAS COLLECTED AND WILL BE SENT. Vicente Males MD Pathologist, Electronic Signature (Case signed 07/16/2017) Specimen Clinical Information The previously biopsied mass occupying the majority of the right inferior gland measures 4.2 x 3.5 x 2.4 cm, Slightly enlarged compared to 3.5 x 2.9 x 2.3 cm previously Source Thyroid, Fine Needle Aspiration, Rt Lobe RLP (Specimen 1 of 1, collected on  07/15/17 )  AFIRMA: benign (ROM 4%), Negative malignancy classifiers.  Philemon Kingdom, MD PhD [facility] Endocrinology

## 2017-05-28 LAB — THYROGLOBULIN ANTIBODY

## 2017-05-28 LAB — THYROID PEROXIDASE ANTIBODY: THYROID PEROXIDASE ANTIBODY: 2 [IU]/mL (ref <9)

## 2017-06-05 ENCOUNTER — Ambulatory Visit
Admission: RE | Admit: 2017-06-05 | Discharge: 2017-06-05 | Disposition: A | Discharge status: Home / Self Care | Payer: 59 | Source: Ambulatory Visit | Attending: Internal Medicine | Admitting: Internal Medicine

## 2017-06-05 DIAGNOSIS — E041 Nontoxic single thyroid nodule: Secondary | ICD-10-CM | POA: Diagnosis not present

## 2017-06-06 ENCOUNTER — Encounter: Payer: Self-pay | Admitting: Internal Medicine

## 2017-06-06 ENCOUNTER — Encounter: Payer: Self-pay | Admitting: Family Medicine

## 2017-06-10 ENCOUNTER — Other Ambulatory Visit: Payer: Self-pay | Admitting: Internal Medicine

## 2017-06-10 DIAGNOSIS — E042 Nontoxic multinodular goiter: Secondary | ICD-10-CM

## 2017-07-15 ENCOUNTER — Ambulatory Visit
Admission: RE | Admit: 2017-07-15 | Discharge: 2017-07-15 | Disposition: A | Discharge status: Home / Self Care | Payer: 59 | Source: Ambulatory Visit | Attending: Internal Medicine | Admitting: Internal Medicine

## 2017-07-15 ENCOUNTER — Other Ambulatory Visit (HOSPITAL_COMMUNITY)
Admission: RE | Admit: 2017-07-15 | Discharge: 2017-07-15 | Disposition: A | Discharge status: Home / Self Care | Payer: 59 | Source: Ambulatory Visit | Attending: Interventional Radiology | Admitting: Interventional Radiology

## 2017-07-15 DIAGNOSIS — E041 Nontoxic single thyroid nodule: Secondary | ICD-10-CM | POA: Insufficient documentation

## 2017-07-15 DIAGNOSIS — E079 Disorder of thyroid, unspecified: Secondary | ICD-10-CM | POA: Diagnosis not present

## 2017-07-23 DIAGNOSIS — E041 Nontoxic single thyroid nodule: Secondary | ICD-10-CM | POA: Diagnosis not present

## 2017-08-04 ENCOUNTER — Encounter: Payer: Self-pay | Admitting: Internal Medicine

## 2017-08-05 ENCOUNTER — Encounter (HOSPITAL_COMMUNITY): Payer: Self-pay | Admitting: Interventional Radiology

## 2017-08-18 ENCOUNTER — Telehealth: Payer: Self-pay

## 2017-08-18 NOTE — Telephone Encounter (Signed)
Is this a prior authorization you will complete?

## 2017-08-18 NOTE — Telephone Encounter (Signed)
Seth Bake calling from [place] to request a PA for molecular testing- she stated she was just calling to make sure she has the correct fax number for [place]- call back number for her is [phone]

## 2017-08-18 NOTE — Telephone Encounter (Signed)
I will look at the PA and fill it out when they fax it.

## 2017-08-24 ENCOUNTER — Other Ambulatory Visit: Payer: Self-pay | Admitting: Family Medicine

## 2017-08-30 ENCOUNTER — Other Ambulatory Visit: Payer: Self-pay | Admitting: Family Medicine

## 2017-09-04 ENCOUNTER — Other Ambulatory Visit: Payer: Self-pay | Admitting: Family Medicine

## 2017-09-04 DIAGNOSIS — Z1231 Encounter for screening mammogram for malignant neoplasm of breast: Secondary | ICD-10-CM

## 2017-09-05 ENCOUNTER — Telehealth: Payer: Self-pay | Admitting: Emergency Medicine

## 2017-09-05 NOTE — Telephone Encounter (Signed)
Called to inform them we have not gotten this fax, gave fax number to her and they are refaxing form

## 2017-09-05 NOTE — Telephone Encounter (Signed)
[facility] called and wants to know if you have received a PA for molecule testing. Please check into this and return their phone call. Thanks.

## 2017-09-08 NOTE — Telephone Encounter (Signed)
Received two faxes one for an online PA to be done by Dr. Cruzita Lederer and the other to [facility] to be able to process the PA on the Dr's behalf. I informed them that Dr. Cruzita Lederer would not do an online PA for this it needed to be in fax form and they stated that in that case to fill the [place] form out. They are aware Dr. Cruzita Lederer will not be back to sign until the 19th. They stated that stamps are not accepted.

## 2017-09-11 ENCOUNTER — Other Ambulatory Visit: Payer: Self-pay | Admitting: *Deleted

## 2017-09-11 MED ORDER — SIMVASTATIN 40 MG PO TABS: ORAL_TABLET | ORAL | 0 refills | Status: DC

## 2017-09-23 ENCOUNTER — Telehealth: Payer: Self-pay

## 2017-09-23 ENCOUNTER — Telehealth: Payer: Self-pay | Admitting: Internal Medicine

## 2017-09-23 NOTE — Telephone Encounter (Signed)
This was faxed yesterday and per previous messages they have been told that the form would not be signed nor sent prior to 09/22/17

## 2017-09-23 NOTE — Telephone Encounter (Signed)
Per [facility] stated she is requesting a pre-authorization. Caller is Seth Bake with [employer] who can be reached at [phone]. Patient is Sarah Zuniga with DOB 121/21/1957

## 2017-09-23 NOTE — Telephone Encounter (Signed)
Seth Bake from [place] calling about PA for test on thyroid- please return call at [phone]

## 2017-09-24 NOTE — Telephone Encounter (Signed)
Left message for Sarah Zuniga that we do not have an email address to give, I wrote this on the fax to her as well. The provider does not handle PA's via email nor respond to any emails that are not interoffice. I personally can not give my email because I am not the only one who works with this provider and I am not always here.

## 2017-09-29 ENCOUNTER — Ambulatory Visit
Admission: RE | Admit: 2017-09-29 | Discharge: 2017-09-29 | Disposition: A | Discharge status: Home / Self Care | Payer: 59 | Source: Ambulatory Visit | Attending: Family Medicine | Admitting: Family Medicine

## 2017-09-29 DIAGNOSIS — Z1231 Encounter for screening mammogram for malignant neoplasm of breast: Secondary | ICD-10-CM | POA: Diagnosis not present

## 2017-10-02 ENCOUNTER — Telehealth: Payer: Self-pay | Admitting: Internal Medicine

## 2017-10-02 NOTE — Telephone Encounter (Signed)
Seth Bake is aware we have submitted this PA and we are not giving an email address per Dr Cruzita Lederer. This was submitted on 09/22/17.

## 2017-10-02 NOTE — Telephone Encounter (Signed)
[facility] ph# [phone] called re: a PA they sent over 09/23/17 for FNA. Please call the ph# listed above to give status of PA.

## 2017-10-10 ENCOUNTER — Telehealth: Payer: Self-pay | Admitting: Internal Medicine

## 2017-10-10 NOTE — Telephone Encounter (Signed)
Called to speak with Sarah Zuniga because we have already faxed this on 09/22/17 and spoke with multiple people and documented the calls that we will not give an email address the provider does refuse that. I have tried to contact Sarah Zuniga specifically and unable too. Called and spoke with [facility] who works with [facility], [phone] and iniated a PA through them for the Molecular testing. Approved and case ID is [id number], faxed to [facility].

## 2017-10-10 NOTE — Telephone Encounter (Signed)
[facility] is calling about a PA for a test called Afirma They would like to check this status  Phone- [phone] Seth Bake.

## 2017-11-30 DIAGNOSIS — Z23 Encounter for immunization: Secondary | ICD-10-CM | POA: Diagnosis not present

## 2018-03-17 ENCOUNTER — Other Ambulatory Visit: Payer: Self-pay | Admitting: Family Medicine

## 2018-03-17 DIAGNOSIS — I10 Essential (primary) hypertension: Secondary | ICD-10-CM

## 2018-03-31 ENCOUNTER — Encounter: Payer: Self-pay | Admitting: Medical

## 2018-03-31 ENCOUNTER — Ambulatory Visit (INDEPENDENT_AMBULATORY_CARE_PROVIDER_SITE_OTHER): Payer: 59 | Admitting: Medical

## 2018-03-31 VITALS — BP 139/78 | HR 70 | Temp 97.7°F | Resp 16 | Ht 66.0 in | Wt 173.2 lb

## 2018-03-31 DIAGNOSIS — I10 Essential (primary) hypertension: Secondary | ICD-10-CM

## 2018-03-31 DIAGNOSIS — Z0000 Encounter for general adult medical examination without abnormal findings: Secondary | ICD-10-CM | POA: Diagnosis not present

## 2018-03-31 DIAGNOSIS — E010 Iodine-deficiency related diffuse (endemic) goiter: Secondary | ICD-10-CM | POA: Diagnosis not present

## 2018-03-31 LAB — CBC WITH DIFFERENTIAL/PLATELET
BASOS ABS: 0 10*3/uL (ref 0.0–0.1)
Basophils Relative: 0.6 % (ref 0.0–3.0)
EOS ABS: 0.1 10*3/uL (ref 0.0–0.7)
Eosinophils Relative: 1.5 % (ref 0.0–5.0)
HCT: 42.7 % (ref 36.0–46.0)
Hemoglobin: 14.6 g/dL (ref 12.0–15.0)
Lymphocytes Relative: 31.5 % (ref 12.0–46.0)
Lymphs Abs: 1.7 10*3/uL (ref 0.7–4.0)
MCHC: 34.1 g/dL (ref 30.0–36.0)
MCV: 92.4 fl (ref 78.0–100.0)
Monocytes Absolute: 0.4 10*3/uL (ref 0.1–1.0)
Monocytes Relative: 7.7 % (ref 3.0–12.0)
NEUTROS ABS: 3.1 10*3/uL (ref 1.4–7.7)
NEUTROS PCT: 58.7 % (ref 43.0–77.0)
PLATELETS: 204 10*3/uL (ref 150.0–400.0)
RBC: 4.63 Mil/uL (ref 3.87–5.11)
RDW: 12.7 % (ref 11.5–15.5)
WBC: 5.3 10*3/uL (ref 4.0–10.5)

## 2018-03-31 LAB — COMPREHENSIVE METABOLIC PANEL
ALT: 31 U/L (ref 0–35)
AST: 28 U/L (ref 0–37)
Albumin: 4.5 g/dL (ref 3.5–5.2)
Alkaline Phosphatase: 53 U/L (ref 39–117)
BUN: 18 mg/dL (ref 6–23)
CO2: 25 mEq/L (ref 19–32)
CREATININE: 0.76 mg/dL (ref 0.40–1.20)
Calcium: 9.1 mg/dL (ref 8.4–10.5)
Chloride: 106 mEq/L (ref 96–112)
GFR: 77.07 mL/min (ref >60.00)
GLUCOSE: 97 mg/dL (ref 70–99)
Potassium: 4.4 mEq/L (ref 3.5–5.1)
SODIUM: 140 meq/L (ref 135–145)
TOTAL PROTEIN: 7.1 g/dL (ref 6.0–8.3)
Total Bilirubin: 0.6 mg/dL (ref 0.2–1.2)

## 2018-03-31 LAB — LIPID PANEL
CHOL/HDL RATIO: 4
Cholesterol: 165 mg/dL (ref 0–200)
HDL: 46 mg/dL (ref >39.00)
LDL Cholesterol: 84 mg/dL (ref 0–99)
NONHDL: 119.39
Triglycerides: 175 mg/dL — ABNORMAL HIGH (ref 0.0–149.0)
VLDL: 35 mg/dL (ref 0.0–40.0)

## 2018-03-31 LAB — TSH: TSH: 0.73 u[IU]/mL (ref 0.35–4.50)

## 2018-03-31 LAB — T4, FREE: FREE T4: 0.88 ng/dL (ref 0.60–1.60)

## 2018-03-31 MED ORDER — SIMVASTATIN 40 MG PO TABS: ORAL_TABLET | ORAL | 3 refills | Status: DC

## 2018-03-31 MED ORDER — LISINOPRIL 20 MG PO TABS: 20.0000 mg | ORAL_TABLET | Freq: Every day | ORAL | 3 refills | Status: DC

## 2018-03-31 NOTE — Progress Notes (Signed)
Subjective:    Patient ID: Sarah Zuniga, female    DOB: [birth year], 63 y.o.   MRN: [id number]  HPI  Pt in for cpe/wellness.  Pt is fasting presently.  Pt has been dieting trying to loose weight. She is eating healthy. Drinks alcohol minimally.  Pt does have thyroid disease. Being followed by endocrinologist and ENT.   Pt is overdue for repeat colonoscopy. She states she got call reminder but pt did not go since her mom was ill at that time.  Up to date on mammogram. Also up to date on pap. Last one normal last year.   Review of Systems  Constitutional: Negative for chills, fatigue and fever.  HENT: Negative for congestion, drooling, sinus pressure and sinus pain.   Respiratory: Negative for cough, chest tightness, shortness of breath and wheezing.   Cardiovascular: Negative for chest pain and palpitations.  Gastrointestinal: Negative for abdominal pain, blood in stool, diarrhea, nausea and vomiting.  Musculoskeletal: Negative for back pain, myalgias and neck stiffness.  Skin: Negative for rash.  Neurological: Negative for dizziness and headaches.  Hematological: Negative for adenopathy. Does not bruise/bleed easily.  Psychiatric/Behavioral: Negative for behavioral problems and confusion. The patient is not nervous/anxious.    Past Medical History:  Diagnosis Date  . Cataract    Left eye  . Diplopia 04/28/2013   Monocular , left  . Hyperlipidemia   . Hypertension   . Pigment dispersion syndrome of both eyes      Social History   Socioeconomic History  . Marital status: Divorced    Spouse name: Not on file  . Number of children: 0  . Years of education: college 3  . Highest education level: Not on file  Occupational History  . Occupation: Publishing rights manager  . 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 Financial resource strain: Not on file  . Food insecurity:    Worry: Not on file    Inability: Not on file  . Transportation  needs:    Medical: Not on file    Non-medical: Not on file  Tobacco Use  . Smoking status: Never Smoker  . Smokeless tobacco: Never Used  Substance and Sexual Activity  . Alcohol use: Yes    Comment: wine weekly  . Drug use: No  . Sexual activity: Not Currently    Partners: Male  Lifestyle  . Physical activity:    Days per week: Not on file    Minutes per session: Not on file  . Stress: Not on file  Relationships  . Social connections:    Talks on phone: Not on file    Gets together: Not on file    Attends religious service: Not on file    Active member of club or organization: Not on file    Attends meetings of clubs or organizations: Not on file    Relationship status: Not on file  . Intimate partner violence:    Fear of current or ex partner: Not on file    Emotionally abused: Not on file    Physically abused: Not on file    Forced sexual activity: Not on file  Other Topics Concern  . Not on file  Social History Narrative   Exercise-- 4x a week    Past Surgical History:  Procedure Laterality Date  . CATARACT EXTRACTION Right   . EYE SURGERY Left 12/08/2014   cataract surgery  . TONSILLECTOMY    .  WISDOM TOOTH EXTRACTION      Family History  Problem Relation Age of Onset  . Diabetes Mother   . Hypertension Mother   . Hyperlipidemia Mother   . Stroke Mother   . Kidney disease Mother   . Cancer Father        lung  . Hyperlipidemia Father   . Hypertension Father   . Hyperlipidemia Sister   . Hypertension Sister   . Breast cancer Paternal Grandmother   . Coronary artery disease Other        1st degree relative female <50.Marland KitchenMarland Kitchenfemale <60    No Known Allergies  Current Outpatient Medications on File Prior to Visit  Medication Sig Dispense Refill  . cetirizine (KLS ALLER-TEC) 10 MG tablet Take 10 mg by mouth daily.      . Cholecalciferol (VITAMIN D3) 2000 units TABS Take 1 tablet by mouth daily.    . Cyanocobalamin (B-12) 5000 MCG SUBL Place 5,000 mcg under  the tongue daily.    Marland Kitchen lisinopril (PRINIVIL,ZESTRIL) 20 MG tablet Take 1 tablet (20 mg total) by mouth daily. Needs ov before any more refills. 30 tablet 0  . Magnesium 400 MG CAPS Take 1 capsule by mouth daily.    . Multiple Vitamin (MULTIVITAMIN) tablet Take 1 tablet by mouth daily.      . Probiotic Product (PROBIOTIC DAILY PO) Take 1 tablet by mouth daily.    . Selenium 100 MCG CAPS Take 1 capsule by mouth daily.    . simvastatin (ZOCOR) 40 MG tablet TAKE 1 TABLET BY MOUTH EVERYDAY AT BEDTIME 90 tablet 0   No current facility-administered medications on file prior to visit.     BP 139/78   Pulse 70   Temp 97.7 F (36.5 C) (Oral)   Resp 16   Ht 5\' 6"  (1.676 m)   Wt 173 lb 3.2 oz (78.6 kg)   SpO2 100%   BMI 27.96 kg/m       Objective:   Physical Exam  General Mental Status- Alert. General Appearance- Not in acute distress.   Skin General: Color- Normal Color. Moisture- Normal Moisture. No worrisome lesions or moles.  Neck Carotid Arteries- Normal color. Moisture- Normal Moisture. No carotid bruits. No JVD. Thyroid enlarged.  Chest and Lung Exam Auscultation: Breath Sounds:-Normal.  Cardiovascular Auscultation:Rythm- Regular. Murmurs & Other Heart Sounds:Auscultation of the heart reveals- No Murmurs.  Abdomen Inspection:-Inspeection Normal. Palpation/Percussion:Note:No mass. Palpation and Percussion of the abdomen reveal- Non Tender, Non Distended + BS, no rebound or guarding.   Neurologic Cranial Nerve exam:- CN III-XII intact(No nystagmus), symmetric smile. Strength:- 5/5 equal and symmetric strength both upper and lower extremities.      Assessment & Plan:  For you wellness exam today I have ordered cbc, cmp, tsh, t4, and  lipid panel.  Vaccine up to date.  Recommend exercise and healthy diet.  We will let you know lab results as they come in.  Follow up date appointment will be determined after lab review.   Please schedule your repeat  colonoscopy. If have trouble getting scheduled please let us know.  Follow up as regularly scheduled with pcp or as needed.   Mackie Pai, PA-C

## 2018-03-31 NOTE — Patient Instructions (Addendum)
For you wellness exam today I have ordered cbc, cmp, tsh, t4, and  lipid panel.  Vaccine up to date.  Recommend exercise and healthy diet.  We will let you know lab results as they come in.  Follow up date appointment will be determined after lab review.   Please schedule your repeat colonoscopy. If have trouble getting scheduled please let us know.  Follow up as regularly scheduled with pcp or as needed.   Preventive Care 40-64 Years, Female Preventive care refers to lifestyle choices and visits with your health care provider that can promote health and wellness. What does preventive care include?   A yearly physical exam. This is also called an annual well check.  Dental exams once or twice a year.  Routine eye exams. Ask your health care provider how often you should have your eyes checked.  Personal lifestyle choices, including: ? Daily care of your teeth and gums. ? Regular physical activity. ? Eating a healthy diet. ? Avoiding tobacco and drug use. ? Limiting alcohol use. ? Practicing safe sex. ? Taking low-dose aspirin daily starting at age 31. ? Taking vitamin and mineral supplements as recommended by your health care provider. What happens during an annual well check? The services and screenings done by your health care provider during your annual well check will depend on your age, overall health, lifestyle risk factors, and family history of disease. Counseling Your health care provider may ask you questions about your:  Alcohol use.  Tobacco use.  Drug use.  Emotional well-being.  Home and relationship well-being.  Sexual activity.  Eating habits.  Work and work Statistician.  Method of birth control.  Menstrual cycle.  Pregnancy history. Screening You may have the following tests or measurements:  Height, weight, and BMI.  Blood pressure.  Lipid and cholesterol levels. These may be checked every 5 years, or more frequently if you are  over 33 years old.  Skin check.  Lung cancer screening. You may have this screening every year starting at age 107 if you have a 30-pack-year history of smoking and currently smoke or have quit within the past 15 years.  Colorectal cancer screening. All adults should have this screening starting at age 41 and continuing until age 69. Your health care provider may recommend screening at age 3. You will have tests every 1-10 years, depending on your results and the type of screening test. People at increased risk should start screening at an earlier age. Screening tests may include: ? Guaiac-based fecal occult blood testing. ? Fecal immunochemical test (FIT). ? Stool DNA test. ? Virtual colonoscopy. ? Sigmoidoscopy. During this test, a flexible tube with a tiny camera (sigmoidoscope) is used to examine your rectum and lower colon. The sigmoidoscope is inserted through your anus into your rectum and lower colon. ? Colonoscopy. During this test, a long, thin, flexible tube with a tiny camera (colonoscope) is used to examine your entire colon and rectum.  Hepatitis C blood test.  Hepatitis B blood test.  Sexually transmitted disease (STD) testing.  Diabetes screening. This is done by checking your blood sugar (glucose) after you have not eaten for a while (fasting). You may have this done every 1-3 years.  Mammogram. This may be done every 1-2 years. Talk to your health care provider about when you should start having regular mammograms. This may depend on whether you have a family history of breast cancer.  BRCA-related cancer screening. This may be done if you have a  family history of breast, ovarian, tubal, or peritoneal cancers.  Pelvic exam and Pap test. This may be done every 3 years starting at age 75. Starting at age 79, this may be done every 5 years if you have a Pap test in combination with an HPV test.  Bone density scan. This is done to screen for osteoporosis. You may have this  scan if you are at high risk for osteoporosis. Discuss your test results, treatment options, and if necessary, the need for more tests with your health care provider. Vaccines Your health care provider may recommend certain vaccines, such as:  Influenza vaccine. This is recommended every year.  Tetanus, diphtheria, and acellular pertussis (Tdap, Td) vaccine. You may need a Td booster every 10 years.  Varicella vaccine. You may need this if you have not been vaccinated.  Zoster vaccine. You may need this after age 31.  Measles, mumps, and rubella (MMR) vaccine. You may need at least one dose of MMR if you were born in 1957 or later. You may also need a second dose.  Pneumococcal 13-valent conjugate (PCV13) vaccine. You may need this if you have certain conditions and were not previously vaccinated.  Pneumococcal polysaccharide (PPSV23) vaccine. You may need one or two doses if you smoke cigarettes or if you have certain conditions.  Meningococcal vaccine. You may need this if you have certain conditions.  Hepatitis A vaccine. You may need this if you have certain conditions or if you travel or work in places where you may be exposed to hepatitis A.  Hepatitis B vaccine. You may need this if you have certain conditions or if you travel or work in places where you may be exposed to hepatitis B.  Haemophilus influenzae type b (Hib) vaccine. You may need this if you have certain conditions. Talk to your health care provider about which screenings and vaccines you need and how often you need them. This information is not intended to replace advice given to you by your health care provider. Make sure you discuss any questions you have with your health care provider. Document Released: 02/17/2015 Document Revised: 03/13/2017 Document Reviewed: 11/22/2014 Elsevier Interactive Patient Education  2019 [employer].

## 2018-04-10 DIAGNOSIS — H21233 Degeneration of iris (pigmentary), bilateral: Secondary | ICD-10-CM | POA: Diagnosis not present

## 2018-04-10 DIAGNOSIS — Z961 Presence of intraocular lens: Secondary | ICD-10-CM | POA: Diagnosis not present

## 2018-10-16 ENCOUNTER — Other Ambulatory Visit: Payer: Self-pay | Admitting: Family Medicine

## 2018-10-16 DIAGNOSIS — Z1231 Encounter for screening mammogram for malignant neoplasm of breast: Secondary | ICD-10-CM

## 2018-10-20 ENCOUNTER — Ambulatory Visit
Admission: RE | Admit: 2018-10-20 | Discharge: 2018-10-20 | Disposition: A | Discharge status: Home / Self Care | Payer: 59 | Source: Ambulatory Visit | Attending: Family Medicine | Admitting: Family Medicine

## 2018-10-20 ENCOUNTER — Other Ambulatory Visit: Payer: Self-pay

## 2018-10-20 DIAGNOSIS — Z1231 Encounter for screening mammogram for malignant neoplasm of breast: Secondary | ICD-10-CM

## 2019-01-15 ENCOUNTER — Other Ambulatory Visit: Payer: Self-pay

## 2019-01-18 ENCOUNTER — Ambulatory Visit: Payer: 59 | Admitting: Internal Medicine

## 2019-01-18 ENCOUNTER — Encounter: Payer: Self-pay | Admitting: Internal Medicine

## 2019-01-18 ENCOUNTER — Other Ambulatory Visit: Payer: Self-pay

## 2019-01-18 VITALS — BP 130/80 | HR 94 | Ht 66.0 in | Wt 177.0 lb

## 2019-01-18 DIAGNOSIS — E042 Nontoxic multinodular goiter: Secondary | ICD-10-CM

## 2019-01-18 NOTE — Progress Notes (Signed)
Patient ID: Sarah Zuniga, female   DOB: Apr 27, 1955, 63 y.o.   MRN: [id number]   This visit occurred during the SARS-CoV-2 public health emergency.  Safety protocols were in place, including screening questions prior to the visit, additional usage of staff PPE, and extensive cleaning of exam room while observing appropriate contact time as indicated for disinfecting solutions.   HPI  Sarah Zuniga is a 63 y.o.-year-old female, referred by her PCP, Dr. Carollee Herter, for evaluation for thyroid nodules.Last visit 1 year and 7 months ago.  She was diagnosed with thyroid nodule in 2016, and at that time she saw ENT (Dr. Wilburn Cornelia).  He ordered a thyroid ultrasound and then biopsied the largest nodule.  Results were benign.  Thyroid U/S (01/31/2015): Entire gland heterogeneous, with increased blood flow; right dominant nodule, 3.5 cm, isoechoic, mixed, with some blood flow, with spongiform appearance per my review of the images.  She has another right mid thyroid nodule, 1.4 cm.  FNA of the right dominant nodule (04/06/2015): Benign  Thyroid U/S (06/05/2017): CLINICAL DATA: Goiter. 63 year old female with a history of multiple thyroid nodules. Patient previously underwent biopsy of the largest right thyroid nodule on 04/06/2015  Parenchymal Echotexture: Markedly heterogenous Isthmus: 0.6 cm Right lobe: 8.0 x 3.0 x 3.5 cm Left lobe: 5.0 x 1.5 x 1.7 cm _______________________________________________________  The previously biopsied mass occupying the majority of the right inferior gland measures 4.2 x 3.5 x 2.4 cm, slightly enlarged compared to 3.5 x 2.9 x 2.3 cm previously.   There is a 1.6 cm anechoic simple cyst in the right mid gland.   Multiple additional hypoechoic cysts and nodules bilaterally, none of which meet criteria for further evaluation.  IMPRESSION: 1. Slight interval enlargement of the previously biopsied thyroid nodule occupying the majority of the right lower lobe compared  to 01/31/2015. Recommend correlation with prior biopsy results. 2. Multiple bilateral thyroid cysts and nodules which do not meet criteria for biopsy or further imaging evaluation.   The right thyroid nodule was large and increased from before, therefore, I suggested biopsy of this nodule.    FNA of this nodule (07/15/2017): AUS/FLUS, however, Afirma molecular test: benign (ROM 4%)  She continues to feel nodules in neck and that the food occasionally gets stuck in her throat.  She denies: - hoarseness - dysphagia - choking - SOB with lying down  I reviewed her TFTs: Lab Results  Component Value Date   TSH 0.73 03/31/2018   TSH 0.75 02/27/2017   TSH 0.90 01/24/2016   TSH 0.60 01/19/2015   TSH 0.877 01/07/2014   TSH 0.80 04/19/2013   TSH 1.02 12/07/2012   TSH 0.36 12/05/2011   TSH 0.52 11/29/2010   TSH 0.97 11/09/2009   FREET4 0.88 03/31/2018   FREET4 0.86 12/05/2011   Her TPO and ATA antibodies were not elevated  She was taking selenium 100 mcg daily and also 1 teaspoon of Dulse - red algae (iodine) daily.  She is now off of these supplements.  + FH of thyroid ds. - M, sisters. No FH of thyroid cancer. No h/o radiation tx to head or neck.  No seaweed or kelp. No recent contrast studies. No herbal supplements. No Biotin use. No recent steroids use.   Pt also has a history of an autoimmune ds.  - increased reabsorption of the teeth.  ROS: Constitutional: no weight gain/no weight loss, no fatigue, no subjective hyperthermia, no subjective hypothermia Eyes: no blurry vision, no xerophthalmia ENT: no sore throat, +  see HPI Cardiovascular: no CP/no SOB/no palpitations/no leg swelling Respiratory: no cough/no SOB/no wheezing Gastrointestinal: no N/no V/no D/no C/no acid reflux Musculoskeletal: no muscle aches/no joint aches Skin: no rashes, no hair loss Neurological: no tremors/no numbness/no tingling/no dizziness  I reviewed pt's medications, allergies, PMH, social hx,  family hx, and changes were documented in the history of present illness. Otherwise, unchanged from my initial visit note.  Past Medical History:  Diagnosis Date  . Cataract    Left eye  . Diplopia 04/28/2013   Monocular , left  . Hyperlipidemia   . Hypertension   . Pigment dispersion syndrome of both eyes    Past Surgical History:  Procedure Laterality Date  . CATARACT EXTRACTION Right   . EYE SURGERY Left 12/08/2014   cataract surgery  . TONSILLECTOMY    . WISDOM TOOTH EXTRACTION     Social History   Socioeconomic History  . Marital status: Divorced    Spouse name: Not on file  . Number of children: 0  . Years of education: college 3  . Highest education level: Not on file  Occupational History  . Occupation: Freight forwarder  Social Needs  Tobacco Use  . Smoking status: Never Smoker  . Smokeless tobacco: Never Used  Substance and Sexual Activity  . Alcohol use: Yes    Comment: Wine, vodka-weekend  . Drug use: No  She exercising: Cardio, weights, 4-5 times a week.  Current Outpatient Medications on File Prior to Visit  Medication Sig Dispense Refill  . cetirizine (KLS ALLER-TEC) 10 MG tablet Take 10 mg by mouth daily.      . Cholecalciferol (VITAMIN D3) 2000 units TABS Take 1 tablet by mouth daily.    . Cyanocobalamin (B-12) 5000 MCG SUBL Place 5,000 mcg under the tongue daily.    Marland Kitchen lisinopril (PRINIVIL,ZESTRIL) 20 MG tablet Take 1 tablet (20 mg total) by mouth daily. 90 tablet 3  . Magnesium 400 MG CAPS Take 1 capsule by mouth daily.    . Multiple Vitamin (MULTIVITAMIN) tablet Take 1 tablet by mouth daily.      . Probiotic Product (PROBIOTIC DAILY PO) Take 1 tablet by mouth daily.    . Selenium 100 MCG CAPS Take 1 capsule by mouth daily.    . simvastatin (ZOCOR) 40 MG tablet TAKE 1 TABLET BY MOUTH EVERYDAY AT BEDTIME 90 tablet 3   No current facility-administered medications on file prior to visit.   No Known Allergies Family History  Problem Relation Age of Onset  .  Diabetes Mother   . Hypertension Mother   . Hyperlipidemia Mother   . Stroke Mother   . Kidney disease Mother   . Cancer Father        lung  . Hyperlipidemia Father   . Hypertension Father   . Hyperlipidemia Sister   . Hypertension Sister   . Breast cancer Paternal Grandmother   . Coronary artery disease Other        1st degree relative female <50.Marland KitchenMarland Kitchenfemale <60    PE: BP 130/80   Pulse 94   Ht 5\' 6"  (1.676 m)   Wt 177 lb (80.3 kg)   SpO2 97%   BMI 28.57 kg/m  Wt Readings from Last 3 Encounters:  01/18/19 177 lb (80.3 kg)  03/31/18 173 lb 3.2 oz (78.6 kg)  05/27/17 172 lb (78 kg)   Constitutional: slightly overweight, in NAD Eyes: PERRLA, EOMI, no exophthalmos ENT: moist mucous membranes, + R>L thyromegaly, no cervical lymphadenopathy Cardiovascular: tachycardia, RR, No MRG  Respiratory: CTA B Gastrointestinal: abdomen soft, NT, ND, BS+ Musculoskeletal: no deformities, strength intact in all 4 Skin: moist, warm, no rashes Neurological: no tremor with outstretched hands, DTR normal in all 4   ASSESSMENT: 1. Multiple thyroid nodules  PLAN: 1. Multiple thyroid nodules -Patient has a history of thyroid nodules, of which the dominant nodule was biopsied in 2016 with benign results.  In 2019, she had another biopsy which was inconclusive however, a molecular marker test (Afirma) was benign.  This nodule was not hypoechoic, without microcalcifications, more wide than tall" elevated from the surrounding tissues.  He had a little bit more blood flow inside but not much different compared to the rest of the thyroid, in which the blood flow was also increased.  Of note, her thyroid antibodies were not elevated. She also has another small thyroid nodules, without worrisome features. -She did have mild neck compression symptoms before, but they are stable We discussed that as of now there is no recommendations to repeat the biopsies or to go ahead with thyroidectomy unless she develops  significant neck compression symptoms.  I explained that after hemi-thyroidectomy, she had an approximately 25% risk of becoming hypothyroid. However, we decided that if it continues to grow >> she will need hemithyroidectomy. We will recheck her U/S now. -We reviewed together her TFTs and these were normal in 03/2018.  We will not repeat them today. -I will see her back in 2 years, or sooner, depending on the U/S results and further plan  - time spent with the patient: 15 minutes, of which >50% was spent in obtaining information about her symptoms, reviewing her previous labs, evaluations, and treatments, counseling her about her condition (please see the discussed topics above), and developing a plan to further investigate and treat it; she had a number of questions which I addressed.  Orders Placed This Encounter  Procedures  . US THYROID   Philemon Kingdom, MD PhD [facility]

## 2019-01-18 NOTE — Patient Instructions (Signed)
We will check another thyroid U/S.  Please return in 2 years.

## 2019-02-01 ENCOUNTER — Ambulatory Visit: Payer: 59 | Attending: Internal Medicine

## 2019-02-01 DIAGNOSIS — Z20822 Contact with and (suspected) exposure to covid-19: Secondary | ICD-10-CM

## 2019-02-03 ENCOUNTER — Telehealth: Payer: Self-pay | Admitting: *Deleted

## 2019-02-03 LAB — NOVEL CORONAVIRUS, NAA: SARS-CoV-2, NAA: DETECTED — AB

## 2019-02-03 NOTE — Telephone Encounter (Signed)
Patient has questions about isolation and how long she should isolate- reviewed CDC recommendations with patient and she is doing well as far as her symptoms go.

## 2019-02-09 ENCOUNTER — Other Ambulatory Visit: Payer: 59

## 2019-04-23 ENCOUNTER — Ambulatory Visit: Payer: 59 | Attending: Internal Medicine

## 2019-04-23 DIAGNOSIS — Z23 Encounter for immunization: Secondary | ICD-10-CM

## 2019-04-23 NOTE — Progress Notes (Signed)
° °  Covid-19 Vaccination Clinic  Name:  Sarah Zuniga    MRN: [id number] DOB: May 16, 1955  04/23/2019  [name] was observed post Covid-19 immunization for 15 minutes without incident. She was provided with Vaccine Information Sheet and instruction to access the V-Safe system.   [name] was instructed to call 911 with any severe reactions post vaccine:  Difficulty breathing   Swelling of face and throat   A fast heartbeat   A bad rash all over body   Dizziness and weakness   Immunizations Administered    Name Date Dose VIS Date Route   Pfizer COVID-19 Vaccine 04/23/2019  9:45 AM 0.3 mL 01/15/2019 Intramuscular   Manufacturer: [place]   Lot: [id number]   [place]: [id number]

## 2019-05-18 ENCOUNTER — Ambulatory Visit: Payer: 59 | Attending: Internal Medicine

## 2019-05-18 DIAGNOSIS — Z23 Encounter for immunization: Secondary | ICD-10-CM

## 2019-05-18 NOTE — Progress Notes (Signed)
   Covid-19 Vaccination Clinic  Name:  Sarah Zuniga    MRN: [id number] DOB: March 11, 1955  05/18/2019  [name] was observed post Covid-19 immunization for 15 minutes without incident. She was provided with Vaccine Information Sheet and instruction to access the V-Safe system.   [name] was instructed to call 911 with any severe reactions post vaccine: Marland Kitchen Difficulty breathing  . Swelling of face and throat  . A fast heartbeat  . A bad rash all over body  . Dizziness and weakness   Immunizations Administered    Name Date Dose VIS Date Route   Pfizer COVID-19 Vaccine 05/18/2019 10:35 AM 0.3 mL 01/15/2019 Intramuscular   Manufacturer: [place]   Lot: [id number]   [place]: [id number]

## 2019-05-20 ENCOUNTER — Other Ambulatory Visit: Payer: Self-pay | Admitting: Medical

## 2019-05-20 DIAGNOSIS — I10 Essential (primary) hypertension: Secondary | ICD-10-CM

## 2019-09-16 ENCOUNTER — Other Ambulatory Visit: Payer: Self-pay | Admitting: Family Medicine

## 2019-09-16 ENCOUNTER — Telehealth: Payer: Self-pay | Admitting: Family Medicine

## 2019-09-16 DIAGNOSIS — Z0000 Encounter for general adult medical examination without abnormal findings: Secondary | ICD-10-CM

## 2019-09-16 DIAGNOSIS — Z1231 Encounter for screening mammogram for malignant neoplasm of breast: Secondary | ICD-10-CM

## 2019-09-16 NOTE — Telephone Encounter (Signed)
Orders in 

## 2019-09-16 NOTE — Telephone Encounter (Signed)
Caller Jolynn Bajorek  Call Back # [phone]  Patient has a Cpe appointment 10/28/2019 @ 2pm and she would like to come in prior that appointment to have labs.   Please advise if orders could be placed

## 2019-09-16 NOTE — Telephone Encounter (Signed)
Please advise 

## 2019-09-17 NOTE — Telephone Encounter (Signed)
Spoke with patient. Pt put in a morning hold spot

## 2019-10-25 ENCOUNTER — Ambulatory Visit
Admission: RE | Admit: 2019-10-25 | Discharge: 2019-10-25 | Disposition: A | Discharge status: Home / Self Care | Payer: 59 | Source: Ambulatory Visit | Attending: Family Medicine | Admitting: Family Medicine

## 2019-10-25 ENCOUNTER — Other Ambulatory Visit: Payer: Self-pay

## 2019-10-25 DIAGNOSIS — Z1231 Encounter for screening mammogram for malignant neoplasm of breast: Secondary | ICD-10-CM

## 2019-10-28 ENCOUNTER — Other Ambulatory Visit (INDEPENDENT_AMBULATORY_CARE_PROVIDER_SITE_OTHER): Payer: 59

## 2019-10-28 ENCOUNTER — Other Ambulatory Visit: Payer: Self-pay

## 2019-10-28 ENCOUNTER — Encounter: Payer: 59 | Admitting: Family Medicine

## 2019-10-28 DIAGNOSIS — E559 Vitamin D deficiency, unspecified: Secondary | ICD-10-CM

## 2019-10-28 DIAGNOSIS — E538 Deficiency of other specified B group vitamins: Secondary | ICD-10-CM

## 2019-10-29 LAB — VITAMIN D 25 HYDROXY (VIT D DEFICIENCY, FRACTURES): Vit D, 25-Hydroxy: 38 ng/mL (ref 30–100)

## 2019-10-29 LAB — VITAMIN B12: Vitamin B-12: 1831 pg/mL — ABNORMAL HIGH (ref 200–1100)

## 2019-10-30 NOTE — Progress Notes (Signed)
Left message to have results given on 09/27 at appt

## 2019-11-01 ENCOUNTER — Other Ambulatory Visit: Payer: Self-pay

## 2019-11-01 ENCOUNTER — Encounter: Payer: Self-pay | Admitting: Family Medicine

## 2019-11-01 ENCOUNTER — Ambulatory Visit (INDEPENDENT_AMBULATORY_CARE_PROVIDER_SITE_OTHER): Payer: 59 | Admitting: Family Medicine

## 2019-11-01 VITALS — BP 110/80 | HR 63 | Temp 98.1°F | Resp 18 | Ht 66.0 in | Wt 149.2 lb

## 2019-11-01 DIAGNOSIS — E785 Hyperlipidemia, unspecified: Secondary | ICD-10-CM

## 2019-11-01 DIAGNOSIS — I10 Essential (primary) hypertension: Secondary | ICD-10-CM

## 2019-11-01 DIAGNOSIS — Z1211 Encounter for screening for malignant neoplasm of colon: Secondary | ICD-10-CM

## 2019-11-01 DIAGNOSIS — Z0000 Encounter for general adult medical examination without abnormal findings: Secondary | ICD-10-CM | POA: Diagnosis not present

## 2019-11-01 MED ORDER — LISINOPRIL 20 MG PO TABS: 20.0000 mg | ORAL_TABLET | Freq: Every day | ORAL | 3 refills | Status: DC

## 2019-11-01 MED ORDER — SIMVASTATIN 40 MG PO TABS: ORAL_TABLET | ORAL | 3 refills | Status: DC

## 2019-11-01 NOTE — Progress Notes (Signed)
Subjective:     Sarah Zuniga is a 64 y.o. female and is here for a comprehensive physical exam. The patient reports no problems.   Pt also f/u on bp and chol and needs refills / labs   Social History   Socioeconomic History  . Marital status: Divorced    Spouse name: Not on file  . Number of children: 0  . Years of education: college 3  . Highest education level: Not on file  Occupational History  . Occupation: Publishing rights manager  . Occupation: [employer] officer: [employer]  Tobacco Use  . Smoking status: Never Smoker  . Smokeless tobacco: Never Used  Substance and Sexual Activity  . Alcohol use: Yes    Comment: wine weekly  . Drug use: No  . Sexual activity: Not Currently    Partners: Male  Other Topics Concern  . Not on file  Social History Narrative   Exercise-- 4x a week   Social Determinants of Health   Financial Resource Strain:   . Difficulty of Paying Living Expenses: Not on file  Food Insecurity:   . Worried About Charity fundraiser in the Last Year: Not on file  . Ran Out of Food in the Last Year: Not on file  Transportation Needs:   . Lack of Transportation (Medical): Not on file  . Lack of Transportation (Non-Medical): Not on file  Physical Activity:   . Days of Exercise per Week: Not on file  . Minutes of Exercise per Session: Not on file  Stress:   . Feeling of Stress : Not on file  Social Connections:   . Frequency of Communication with Friends and Family: Not on file  . Frequency of Social Gatherings with Friends and Family: Not on file  . Attends Religious Services: Not on file  . Active Member of Clubs or Organizations: Not on file  . Attends Archivist Meetings: Not on file  . Marital Status: Not on file  Intimate Partner Violence:   . Fear of Current or Ex-Partner: Not on file  . Emotionally Abused: Not on file  . Physically Abused: Not on file  . Sexually Abused: Not on file   Health Maintenance  Topic Date Due   . COLONOSCOPY  12/21/2014  . INFLUENZA VACCINE  09/05/2019  . PAP SMEAR-Modifier  02/28/2020  . MAMMOGRAM  10/24/2020  . TETANUS/TDAP  01/18/2025  . COVID-19 Vaccine  Completed  . Hepatitis C Screening  Completed  . HIV Screening  Completed    The following portions of the patient's history were reviewed and updated as appropriate:  She  has a past medical history of Cataract, Diplopia (04/28/2013), Hyperlipidemia, Hypertension, and Pigment dispersion syndrome of both eyes. She does not have any pertinent problems on file. She  has a past surgical history that includes Tonsillectomy; Wisdom tooth extraction; Eye surgery (Left, 12/08/2014); and Cataract extraction (Right). Her family history includes Breast cancer in her paternal grandmother; Cancer in her father; Coronary artery disease in an other family member; Diabetes in her mother; Hyperlipidemia in her father, mother, and sister; Hypertension in her father, mother, and sister; Kidney disease in her mother; Stroke in her mother. She  reports that she has never smoked. She has never used smokeless tobacco. She reports current alcohol use. She reports that she does not use drugs. She has a current medication list which includes the following prescription(s): cetirizine, vitamin d3, b-12, lisinopril, magnesium, multivitamin, probiotic  product, selenium, and simvastatin. Current Outpatient Medications on File Prior to Visit  Medication Sig Dispense Refill  . cetirizine (KLS ALLER-TEC) 10 MG tablet Take 10 mg by mouth daily.      . Cholecalciferol (VITAMIN D3) 2000 units TABS Take 1 tablet by mouth daily.    . Cyanocobalamin (B-12) 5000 MCG SUBL Place 5,000 mcg under the tongue daily.    . Magnesium 400 MG CAPS Take 1 capsule by mouth daily.    . Multiple Vitamin (MULTIVITAMIN) tablet Take 1 tablet by mouth daily.      . Probiotic Product (PROBIOTIC DAILY PO) Take 1 tablet by mouth daily.    . Selenium 100 MCG CAPS Take 1 capsule by mouth  daily.     No current facility-administered medications on file prior to visit.   She has No Known Allergies..  Review of Systems Review of Systems  Constitutional: Negative for activity change, appetite change and fatigue.  HENT: Negative for hearing loss, congestion, tinnitus and ear discharge.  dentist q41m Eyes: Negative for visual disturbance (see optho q1y -- vision corrected to 20/20 with glasses).  Respiratory: Negative for cough, chest tightness and shortness of breath.   Cardiovascular: Negative for chest pain, palpitations and leg swelling.  Gastrointestinal: Negative for abdominal pain, diarrhea, constipation and abdominal distention.  Genitourinary: Negative for urgency, frequency, decreased urine volume and difficulty urinating.  Musculoskeletal: Negative for back pain, arthralgias and gait problem.  Skin: Negative for color change, pallor and rash.  Neurological: Negative for dizziness, light-headedness, numbness and headaches.  Hematological: Negative for adenopathy. Does not bruise/bleed easily.  Psychiatric/Behavioral: Negative for suicidal ideas, confusion, sleep disturbance, self-injury, dysphoric mood, decreased concentration and agitation.       Objective:    BP 110/80 (BP Location: Right Arm, Patient Position: Sitting, Cuff Size: Normal)   Pulse 63   Temp 98.1 F (36.7 C) (Oral)   Resp 18   Ht 5\' 6"  (1.676 m)   Wt 149 lb 3.2 oz (67.7 kg)   SpO2 98%   BMI 24.08 kg/m  General appearance: alert, cooperative, appears stated age and no distress Head: Normocephalic, without obvious abnormality, atraumatic Eyes: negative findings: lids and lashes normal, conjunctivae and sclerae normal and pupils equal, round, reactive to light and accomodation Ears: normal TM's and external ear canals both ears Neck: no adenopathy, no carotid bruit, no JVD, supple, symmetrical, trachea midline and thyroid not enlarged, symmetric, no tenderness/mass/nodules Back: symmetric, no  curvature. ROM normal. No CVA tenderness. Lungs: clear to auscultation bilaterally Breasts: deferred  Heart: regular rate and rhythm, S1, S2 normal, no murmur, click, rub or gallop Abdomen: soft, non-tender; bowel sounds normal; no masses,  no organomegaly Pelvic: deferred Extremities: extremities normal, atraumatic, no cyanosis or edema Pulses: 2+ and symmetric Skin: Skin color, texture, turgor normal. No rashes or lesions Lymph nodes: Cervical, supraclavicular, and axillary nodes normal. Neurologic: Alert and oriented X 3, normal strength and tone. Normal symmetric reflexes. Normal coordination and gait    Assessment:    Healthy female exam.      Plan:    ghm utd  Check labs  See After Visit Summary for Counseling Recommendations    1. Essential hypertension Well controlled, no changes to meds. Encouraged heart healthy diet such as the DASH diet and exercise as tolerated.   - lisinopril (ZESTRIL) 20 MG tablet; Take 1 tablet (20 mg total) by mouth daily.  Dispense: 90 tablet; Refill: 3  2. Hyperlipidemia, unspecified hyperlipidemia type Encouraged heart healthy diet, increase  exercise, avoid trans fats, consider a krill oil cap daily - simvastatin (ZOCOR) 40 MG tablet; TAKE 1 TABLET BY MOUTH EVERYDAY AT BEDTIME  Dispense: 90 tablet; Refill: 3  3. Preventative health care See above  - TSH - Lipid panel - CBC with Differential/Platelet - Comprehensive metabolic panel  4. Colon cancer screening   - Ambulatory referral to Gastroenterology

## 2019-11-01 NOTE — Addendum Note (Signed)
Addended by: Angelina Pih on: 11/01/2019 03:46 PM   Modules accepted: Orders

## 2019-11-01 NOTE — Assessment & Plan Note (Signed)
Encouraged heart healthy diet, increase exercise, avoid trans fats, consider a krill oil cap daily 

## 2019-11-01 NOTE — Assessment & Plan Note (Signed)
Well controlled, no changes to meds. Encouraged heart healthy diet such as the DASH diet and exercise as tolerated.  °

## 2019-11-01 NOTE — Assessment & Plan Note (Signed)
See above

## 2019-11-01 NOTE — Patient Instructions (Signed)

## 2019-11-02 LAB — COMPREHENSIVE METABOLIC PANEL
AG Ratio: 1.8 (calc) (ref 1.0–2.5)
ALT: 20 U/L (ref 6–29)
AST: 22 U/L (ref 10–35)
Albumin: 4.8 g/dL (ref 3.6–5.1)
Alkaline phosphatase (APISO): 52 U/L (ref 37–153)
BUN: 19 mg/dL (ref 7–25)
CO2: 27 mmol/L (ref 20–32)
Calcium: 10.2 mg/dL (ref 8.6–10.4)
Chloride: 103 mmol/L (ref 98–110)
Creat: 0.77 mg/dL (ref 0.50–0.99)
Globulin: 2.7 g/dL (calc) (ref 1.9–3.7)
Glucose, Bld: 69 mg/dL (ref 65–99)
Potassium: 4.7 mmol/L (ref 3.5–5.3)
Sodium: 142 mmol/L (ref 135–146)
Total Bilirubin: 0.6 mg/dL (ref 0.2–1.2)
Total Protein: 7.5 g/dL (ref 6.1–8.1)

## 2019-11-02 LAB — CBC WITH DIFFERENTIAL/PLATELET
Absolute Monocytes: 410 cells/uL (ref 200–950)
Basophils Absolute: 19 cells/uL (ref 0–200)
Basophils Relative: 0.3 %
Eosinophils Absolute: 70 cells/uL (ref 15–500)
Eosinophils Relative: 1.1 %
HCT: 47.1 % — ABNORMAL HIGH (ref 35.0–45.0)
Hemoglobin: 15.6 g/dL — ABNORMAL HIGH (ref 11.7–15.5)
Lymphs Abs: 2029 cells/uL (ref 850–3900)
MCH: 31.3 pg (ref 27.0–33.0)
MCHC: 33.1 g/dL (ref 32.0–36.0)
MCV: 94.6 fL (ref 80.0–100.0)
MPV: 11.9 fL (ref 7.5–12.5)
Monocytes Relative: 6.4 %
Neutro Abs: 3872 cells/uL (ref 1500–7800)
Neutrophils Relative %: 60.5 %
Platelets: 223 10*3/uL (ref 140–400)
RBC: 4.98 10*6/uL (ref 3.80–5.10)
RDW: 12.2 % (ref 11.0–15.0)
Total Lymphocyte: 31.7 %
WBC: 6.4 10*3/uL (ref 3.8–10.8)

## 2019-11-02 LAB — LIPID PANEL
Cholesterol: 211 mg/dL — ABNORMAL HIGH (ref <200)
HDL: 64 mg/dL (ref >=50)
LDL Cholesterol (Calc): 111 mg/dL (calc) — ABNORMAL HIGH
Non-HDL Cholesterol (Calc): 147 mg/dL (calc) — ABNORMAL HIGH (ref <130)
Total CHOL/HDL Ratio: 3.3 (calc) (ref <5.0)
Triglycerides: 249 mg/dL — ABNORMAL HIGH (ref <150)

## 2019-11-02 LAB — TSH: TSH: 0.71 mIU/L (ref 0.40–4.50)

## 2019-11-02 LAB — SPECIMEN COMPROMISED

## 2019-11-03 ENCOUNTER — Other Ambulatory Visit: Payer: Self-pay | Admitting: Family Medicine

## 2019-11-03 DIAGNOSIS — E785 Hyperlipidemia, unspecified: Secondary | ICD-10-CM

## 2020-09-04 ENCOUNTER — Telehealth: Payer: Self-pay | Admitting: Internal Medicine

## 2020-09-04 ENCOUNTER — Other Ambulatory Visit: Payer: Self-pay | Admitting: Family Medicine

## 2020-09-04 DIAGNOSIS — Z1231 Encounter for screening mammogram for malignant neoplasm of breast: Secondary | ICD-10-CM

## 2020-09-04 NOTE — Telephone Encounter (Signed)
Pt is calling in voiced that she was supposed to have a [place] of her thyroid and states that she was unable to make it to the apps and is wondering if another order can be put in for her to have one.

## 2020-09-05 NOTE — Telephone Encounter (Signed)
Can we get an appt scheduled and let her know Dr Cruzita Lederer will be advised of her referral needs when she returns to the office.

## 2020-09-05 NOTE — Telephone Encounter (Signed)
Will this have to wait for Dr Cruzita Lederer or can you place another referral?

## 2020-09-06 NOTE — Telephone Encounter (Signed)
T, i agree, let me see her first. She needs an ultrasound, not a bx, but will likely order when i see her. Ty! C

## 2020-09-19 NOTE — Telephone Encounter (Signed)
Scheduled an appointment for 09/27/20

## 2020-09-26 NOTE — Progress Notes (Signed)
Patient ID: Sarah Zuniga, female   DOB: 1955/06/18, 65 y.o.   MRN: [id number]   This visit occurred during the SARS-CoV-2 public health emergency.  Safety protocols were in place, including screening questions prior to the visit, additional usage of staff PPE, and extensive cleaning of exam room while observing appropriate contact time as indicated for disinfecting solutions.   HPI  Sarah Zuniga is a very pleasant 65 y.o.-year-old female, initially referred by her PCP, Dr. Carollee Herter, returning for  thyroid nodules. Last visit 1 year and 8 months ago.  Interim history: She continues to feel nodules in her neck and the food occasionally getting stuck in her throat.  She feels that the nodule is slightly more visible, and is not sure whether this may be due to weight loss.  She was doing weight watchers and lost a significant amount of weight, but gained some back. She got Covid19 in 01/2019 >> could not get the thyroid U/S that was ordered at last OV. She lost taste and smell x 1 year, OTW, she had a mild form.  Reviewed history: She was diagnosed with thyroid nodule in 2016, and at that time she saw ENT (Dr. Wilburn Cornelia).  He ordered a thyroid ultrasound and then biopsied the largest nodule.  Results were benign.  Thyroid U/S (01/31/2015): Entire gland heterogeneous, with increased blood flow; right dominant nodule, 3.5 cm, isoechoic, mixed, with some blood flow, with spongiform appearance per my review of the images.  She has another right mid thyroid nodule, 1.4 cm.  FNA of the right dominant nodule (04/06/2015): Benign  Thyroid U/S (06/05/2017): CLINICAL DATA:  Goiter. 65 year old female with a history of multiple thyroid nodules. Patient previously underwent biopsy of the largest right thyroid nodule on 04/06/2015  Parenchymal Echotexture: Markedly heterogenous Isthmus: 0.6 cm Right lobe: 8.0 x 3.0 x 3.5 cm Left lobe: 5.0 x 1.5 x 1.7  cm _______________________________________________________  The previously biopsied mass occupying the majority of the right inferior gland measures 4.2 x 3.5 x 2.4 cm, slightly enlarged compared to 3.5 x 2.9 x 2.3 cm previously.   There is a 1.6 cm anechoic simple cyst in the right mid gland.   Multiple additional hypoechoic cysts and nodules bilaterally, none of which meet criteria for further evaluation.  IMPRESSION: 1. Slight interval enlargement of the previously biopsied thyroid nodule occupying the majority of the right lower lobe compared to 01/31/2015. Recommend correlation with prior biopsy results. 2. Multiple bilateral thyroid cysts and nodules which do not meet criteria for biopsy or further imaging evaluation.   The right thyroid nodule was large and increased from before, therefore, I suggested biopsy of this nodule.    FNA of this nodule (07/15/2017): AUS/FLUS, however, Afirma molecular test: benign (ROM 4%)  She denies: - dysphagia - choking - SOB with lying down Occasional hoarseness when talking more, also occasional difficulty swallowing - chews food well.  I reviewed her TFTs: Lab Results  Component Value Date   TSH 0.71 11/01/2019   TSH 0.73 03/31/2018   TSH 0.75 02/27/2017   TSH 0.90 01/24/2016   TSH 0.60 01/19/2015   TSH 0.877 01/07/2014   TSH 0.80 04/19/2013   TSH 1.02 12/07/2012   TSH 0.36 12/05/2011   TSH 0.52 11/29/2010   FREET4 0.88 03/31/2018   FREET4 0.86 12/05/2011   Her TPO and ATA antibodies were not elevated.  She was taking selenium 100 mcg daily and also 1 teaspoon of Dulse - red algae (iodine) daily.  She  stopped the supplement before last visit.  + FH of thyroid ds. - M, sisters. No FH of thyroid cancer. No h/o radiation tx to head or neck.  No herbal supplements. On collagen supplement. No Biotin use in last 6 months. No recent steroids use.   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  ROS: Constitutional: + Both weight gain and weight loss, no fatigue, no subjective hyperthermia, no subjective hypothermia Eyes: no blurry vision, no xerophthalmia ENT: no sore throat, + see HPI Cardiovascular: no CP/no SOB/no palpitations/no leg swelling Respiratory: no cough/no SOB/no wheezing Gastrointestinal: no N/no V/no D/no C/no acid reflux Musculoskeletal: no muscle aches/no joint aches Skin: no rashes, no hair loss Neurological: no tremors/no numbness/no tingling/no dizziness  I reviewed pt's medications, allergies, PMH, social hx, family hx, and changes were documented in the history of present illness. Otherwise, unchanged from my initial visit note.  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 , left   Hyperlipidemia    Hypertension    Pigment dispersion syndrome of both eyes    Past Surgical History:  Procedure Laterality Date   CATARACT EXTRACTION Right    EYE SURGERY Left 12/08/2014   cataract surgery   TONSILLECTOMY     WISDOM TOOTH EXTRACTION     Social History   Socioeconomic History   Marital status: Divorced    Spouse name: Not on file   Number of children: 0   Years of education: college 3   Highest education level: Not on file  Occupational History   Occupation: Freight forwarder  Social Needs  Tobacco Use   Smoking status: Never Smoker   Smokeless tobacco: Never Used  Substance and Sexual Activity   Alcohol use: Yes    Comment: Wine, vodka-weekend   Drug use: No  Exercise: Cardio, weights, 4-5 times a week.  Current Outpatient Medications on File Prior to Visit  Medication Sig Dispense Refill   cetirizine (KLS ALLER-TEC) 10 MG tablet Take 10 mg by mouth daily.       Cholecalciferol (VITAMIN D3) 2000 units TABS Take 1 tablet by mouth daily.     Cyanocobalamin (B-12) 5000 MCG SUBL Place 5,000 mcg under the tongue daily.     lisinopril (ZESTRIL) 20 MG tablet Take 1 tablet (20 mg total) by  mouth daily. 90 tablet 3   Magnesium 400 MG CAPS Take 1 capsule by mouth daily.     Multiple Vitamin (MULTIVITAMIN) tablet Take 1 tablet by mouth daily.       Probiotic Product (PROBIOTIC DAILY PO) Take 1 tablet by mouth daily.     Selenium 100 MCG CAPS Take 1 capsule by mouth daily.     simvastatin (ZOCOR) 40 MG tablet TAKE 1 TABLET BY MOUTH EVERYDAY AT BEDTIME 90 tablet 3   No current facility-administered medications on file prior to visit.   No Known Allergies Family History  Problem Relation Age of Onset   Diabetes Mother    Hypertension Mother    Hyperlipidemia Mother    Stroke Mother    Kidney disease Mother    Cancer Father        lung   Hyperlipidemia Father    Hypertension Father    Hyperlipidemia Sister    Hypertension Sister    Breast cancer Paternal Grandmother    Coronary artery disease Other        1st  degree relative female <50.Marland KitchenMarland Kitchenfemale <60    PE: BP 128/82 (BP Location: Right Arm, Patient Position: Sitting, Cuff Size: Normal)   Pulse 82   Ht '5\' 6"'$  (1.676 m)   Wt 160 lb 9.6 oz (72.8 kg)   SpO2 97%   BMI 25.92 kg/m  Wt Readings from Last 3 Encounters:  09/27/20 160 lb 9.6 oz (72.8 kg)  11/01/19 149 lb 3.2 oz (67.7 kg)  01/18/19 177 lb (80.3 kg)   Constitutional: normal weight, in NAD Eyes: PERRLA, EOMI, no exophthalmos ENT: moist mucous membranes, + R>L thyromegaly, no cervical lymphadenopathy Cardiovascular: tachycardia, RR, No MRG Respiratory: CTA B Gastrointestinal: abdomen soft, NT, ND, BS+ Musculoskeletal: no deformities, strength intact in all 4 Skin: moist, warm, no rashes Neurological: no tremor with outstretched hands, DTR normal in all 4  ASSESSMENT: 1. Multiple thyroid nodules  PLAN: 1. Multiple thyroid nodules -Patient has a history of thyroid nodules, of which the dominant nodule was biopsied in 2016 with benign results.  In 2019, she had another thyroid ultrasound which showed a slightly larger nodule and we proceeded with nodule  biopsy, which was inconclusive, however, the molecular marker test (Afirma) results were benign.  This nodule was not hypoechoic, did not have microcalcifications, was more wide than tall, and with smooth margins.  It had some blood flow inside, but not much different compared to the rest of the thyroid, in which the blood flow was also increased.  Of note, her thyroid antibodies were not elevated.  She also had another small thyroid nodule, without worrisome features. -She continues to have very mild neck compression symptoms, stable -Previous TFTs have been normal, including 10/2019.  She will have a repeat next month by PCP -we discussed that she does not have an indication for a repeat biopsy or for thyroid resection unless she develops significant neck compression symptoms or the nodule continues to grow.  However, I did order a thyroid ultrasound, which she did not have yet as she developed COVID around that time. At today's visit, we will repeat a thyroid ultrasound. -At this visit, we discussed about the risk of developing hypothyroidism after hemithyroidectomy, which is approximately 25% -We discussed that in that case, she needs to be on levothyroxine most likely for the rest of her life.  She is aware of the treatment as her mother has been on this since her 58s. -I will see her back in 2 year, or sooner, depending on the results of her ultrasound or appearance of new neck compression symptoms  Orders Placed This Encounter  Procedures   US THYROID   Thyroid U/S (10/02/2020): Parenchymal Echotexture: Moderately heterogeneous Isthmus: 0.6 cm Right lobe: 8.7 x 3.1 x 4.0 cm Left lobe: 5.4 x 1.4 x 1.1 cm _________________________________________________________   Nodule 1: 1.0 x 0.5 x 1.0 cm spongiform nodule in the superior right thyroid lobe does not meet criteria for imaging surveillance or FNA. ________________________________________________________   Nodule 2: 1.3 x 0.9 x 1.1 cm  mixed solid cystic nodule in the superior right thyroid lobe does not meet criteria for imaging surveillance or FNA.  _________________________________________________________   Nodule 3: 1.6 x 1.4 x 1.2 cm spongiform nodule in the mid right thyroid lobe does not meet criteria for imaging surveillance or FNA.  _________________________________________________________   Additional subcentimeter left thyroid nodules do not meet criteria for FNA or imaging surveillance.   IMPRESSION: 1. Previously biopsied right thyroid nodule is not well defined on the current examination. 2. Remaining thyroid nodules do  not meet criteria for FNA or imaging surveillance.   The above is in keeping with the ACR TI-RADS recommendations - J Am Coll Radiol 2017;14:587-595.     Electronically Signed   By: Miachel Roux M.D.   On: 10/02/2020 12:59  Philemon Kingdom, MD PhD [facility]

## 2020-09-27 ENCOUNTER — Other Ambulatory Visit: Payer: Self-pay

## 2020-09-27 ENCOUNTER — Encounter: Payer: Self-pay | Admitting: Internal Medicine

## 2020-09-27 ENCOUNTER — Ambulatory Visit: Payer: 59 | Admitting: Internal Medicine

## 2020-09-27 VITALS — BP 128/82 | HR 82 | Ht 66.0 in | Wt 160.6 lb

## 2020-09-27 DIAGNOSIS — E042 Nontoxic multinodular goiter: Secondary | ICD-10-CM

## 2020-09-27 NOTE — Patient Instructions (Signed)
We will check another thyroid U/S.  Please return in 2 years.

## 2020-10-02 ENCOUNTER — Ambulatory Visit
Admission: RE | Admit: 2020-10-02 | Discharge: 2020-10-02 | Disposition: A | Discharge status: Home / Self Care | Payer: 59 | Source: Ambulatory Visit | Attending: Internal Medicine | Admitting: Internal Medicine

## 2020-10-02 DIAGNOSIS — E042 Nontoxic multinodular goiter: Secondary | ICD-10-CM

## 2020-10-25 ENCOUNTER — Other Ambulatory Visit: Payer: Self-pay

## 2020-10-25 ENCOUNTER — Ambulatory Visit
Admission: RE | Admit: 2020-10-25 | Discharge: 2020-10-25 | Disposition: A | Discharge status: Home / Self Care | Payer: 59 | Source: Ambulatory Visit | Attending: Family Medicine | Admitting: Family Medicine

## 2020-10-25 DIAGNOSIS — Z1231 Encounter for screening mammogram for malignant neoplasm of breast: Secondary | ICD-10-CM

## 2020-11-07 ENCOUNTER — Encounter: Payer: Self-pay | Admitting: Family Medicine

## 2020-11-07 ENCOUNTER — Ambulatory Visit (INDEPENDENT_AMBULATORY_CARE_PROVIDER_SITE_OTHER): Payer: 59 | Admitting: Family Medicine

## 2020-11-07 ENCOUNTER — Other Ambulatory Visit (HOSPITAL_COMMUNITY)
Admission: RE | Admit: 2020-11-07 | Discharge: 2020-11-07 | Disposition: A | Discharge status: Home / Self Care | Payer: 59 | Source: Ambulatory Visit | Attending: Emergency Medicine | Admitting: Emergency Medicine

## 2020-11-07 ENCOUNTER — Other Ambulatory Visit: Payer: Self-pay

## 2020-11-07 VITALS — BP 122/80 | HR 72 | Temp 97.5°F | Resp 18 | Ht 66.0 in | Wt 158.8 lb

## 2020-11-07 DIAGNOSIS — E041 Nontoxic single thyroid nodule: Secondary | ICD-10-CM

## 2020-11-07 DIAGNOSIS — Z0001 Encounter for general adult medical examination with abnormal findings: Secondary | ICD-10-CM

## 2020-11-07 DIAGNOSIS — Z0000 Encounter for general adult medical examination without abnormal findings: Secondary | ICD-10-CM | POA: Insufficient documentation

## 2020-11-07 DIAGNOSIS — R490 Dysphonia: Secondary | ICD-10-CM

## 2020-11-07 DIAGNOSIS — E042 Nontoxic multinodular goiter: Secondary | ICD-10-CM

## 2020-11-07 LAB — CBC WITH DIFFERENTIAL/PLATELET
Basophils Absolute: 0 10*3/uL (ref 0.0–0.1)
Basophils Relative: 0.5 % (ref 0.0–3.0)
Eosinophils Absolute: 0.1 10*3/uL (ref 0.0–0.7)
Eosinophils Relative: 1.3 % (ref 0.0–5.0)
HCT: 44.6 % (ref 36.0–46.0)
Hemoglobin: 15.3 g/dL — ABNORMAL HIGH (ref 12.0–15.0)
Lymphocytes Relative: 30.5 % (ref 12.0–46.0)
Lymphs Abs: 1.6 10*3/uL (ref 0.7–4.0)
MCHC: 34.3 g/dL (ref 30.0–36.0)
MCV: 93.4 fl (ref 78.0–100.0)
Monocytes Absolute: 0.4 10*3/uL (ref 0.1–1.0)
Monocytes Relative: 7.4 % (ref 3.0–12.0)
Neutro Abs: 3.2 10*3/uL (ref 1.4–7.7)
Neutrophils Relative %: 60.3 % (ref 43.0–77.0)
Platelets: 184 10*3/uL (ref 150.0–400.0)
RBC: 4.77 Mil/uL (ref 3.87–5.11)
RDW: 12.8 % (ref 11.5–15.5)
WBC: 5.2 10*3/uL (ref 4.0–10.5)

## 2020-11-07 LAB — COMPREHENSIVE METABOLIC PANEL
ALT: 19 U/L (ref 0–35)
AST: 23 U/L (ref 0–37)
Albumin: 4.6 g/dL (ref 3.5–5.2)
Alkaline Phosphatase: 51 U/L (ref 39–117)
BUN: 15 mg/dL (ref 6–23)
CO2: 28 mEq/L (ref 19–32)
Calcium: 10 mg/dL (ref 8.4–10.5)
Chloride: 103 mEq/L (ref 96–112)
Creatinine, Ser: 0.76 mg/dL (ref 0.40–1.20)
GFR: 82.6 mL/min (ref >60.00)
Glucose, Bld: 84 mg/dL (ref 70–99)
Potassium: 4.8 mEq/L (ref 3.5–5.1)
Sodium: 139 mEq/L (ref 135–145)
Total Bilirubin: 0.6 mg/dL (ref 0.2–1.2)
Total Protein: 7.1 g/dL (ref 6.0–8.3)

## 2020-11-07 LAB — LIPID PANEL
Cholesterol: 180 mg/dL (ref 0–200)
HDL: 56.3 mg/dL (ref >39.00)
LDL Cholesterol: 94 mg/dL (ref 0–99)
NonHDL: 123.48
Total CHOL/HDL Ratio: 3
Triglycerides: 148 mg/dL (ref 0.0–149.0)
VLDL: 29.6 mg/dL (ref 0.0–40.0)

## 2020-11-07 NOTE — Patient Instructions (Signed)
Preventive Care 40-64 Years Old, Female Preventive care refers to lifestyle choices and visits with your health care provider that can promote health and wellness. This includes: A yearly physical exam. This is also called an annual wellness visit. Regular dental and eye exams. Immunizations. Screening for certain conditions. Healthy lifestyle choices, such as: Eating a healthy diet. Getting regular exercise. Not using drugs or products that contain nicotine and tobacco. Limiting alcohol use. What can I expect for my preventive care visit? Physical exam Your health care provider will check your: Height and weight. These may be used to calculate your BMI (body mass index). BMI is a measurement that tells if you are at a healthy weight. Heart rate and blood pressure. Body temperature. Skin for abnormal spots. Counseling Your health care provider may ask you questions about your: Past medical problems. Family's medical history. Alcohol, tobacco, and drug use. Emotional well-being. Home life and relationship well-being. Sexual activity. Diet, exercise, and sleep habits. Work and work environment. Access to firearms. Method of birth control. Menstrual cycle. Pregnancy history. What immunizations do I need? Vaccines are usually given at various ages, according to a schedule. Your health care provider will recommend vaccines for you based on your age, medical history, and lifestyle or other factors, such as travel or where you work. What tests do I need? Blood tests Lipid and cholesterol levels. These may be checked every 5 years, or more often if you are over 50 years old. Hepatitis C test. Hepatitis B test. Screening Lung cancer screening. You may have this screening every year starting at age 55 if you have a 30-pack-year history of smoking and currently smoke or have quit within the past 15 years. Colorectal cancer screening. All adults should have this screening starting at  age 50 and continuing until age 75. Your health care provider may recommend screening at age 45 if you are at increased risk. You will have tests every 1-10 years, depending on your results and the type of screening test. Diabetes screening. This is done by checking your blood sugar (glucose) after you have not eaten for a while (fasting). You may have this done every 1-3 years. Mammogram. This may be done every 1-2 years. Talk with your health care provider about when you should start having regular mammograms. This may depend on whether you have a family history of breast cancer. BRCA-related cancer screening. This may be done if you have a family history of breast, ovarian, tubal, or peritoneal cancers. Pelvic exam and Pap test. This may be done every 3 years starting at age 21. Starting at age 30, this may be done every 5 years if you have a Pap test in combination with an HPV test. Other tests STD (sexually transmitted disease) testing, if you are at risk. Bone density scan. This is done to screen for osteoporosis. You may have this scan if you are at high risk for osteoporosis. Talk with your health care provider about your test results, treatment options, and if necessary, the need for more tests. Follow these instructions at home: Eating and drinking  Eat a diet that includes fresh fruits and vegetables, whole grains, lean protein, and low-fat dairy products. Take vitamin and mineral supplements as recommended by your health care provider. Do not drink alcohol if: Your health care provider tells you not to drink. You are pregnant, may be pregnant, or are planning to become pregnant. If you drink alcohol: Limit how much you have to 0-1 drink a day. Be   aware of how much alcohol is in your drink. In the [place] one drink equals one 12 oz bottle of beer (355 mL), one 5 oz glass of wine (148 mL), or one 1 oz glass of hard liquor (44 mL). Lifestyle Take daily care of your teeth and  gums. Brush your teeth every morning and night with fluoride toothpaste. Floss one time each day. Stay active. Exercise for at least 30 minutes 5 or more days each week. Do not use any products that contain nicotine or tobacco, such as cigarettes, e-cigarettes, and chewing tobacco. If you need help quitting, ask your health care provider. Do not use drugs. If you are sexually active, practice safe sex. Use a condom or other form of protection to prevent STIs (sexually transmitted infections). If you do not wish to become pregnant, use a form of birth control. If you plan to become pregnant, see your health care provider for a prepregnancy visit. If told by your health care provider, take low-dose aspirin daily starting at age 63. Find healthy ways to cope with stress, such as: Meditation, yoga, or listening to music. Journaling. Talking to a trusted person. Spending time with friends and family. Safety Always wear your seat belt while driving or riding in a vehicle. Do not drive: If you have been drinking alcohol. Do not ride with someone who has been drinking. When you are tired or distracted. While texting. Wear a helmet and other protective equipment during sports activities. If you have firearms in your house, make sure you follow all gun safety procedures. What's next? Visit your health care provider once a year for an annual wellness visit. Ask your health care provider how often you should have your eyes and teeth checked. Stay up to date on all vaccines. This information is not intended to replace advice given to you by your health care provider. Make sure you discuss any questions you have with your health care provider. Document Revised: 03/31/2020 Document Reviewed: 10/02/2017 Elsevier Patient Education  2022 [employer].

## 2020-11-07 NOTE — Assessment & Plan Note (Signed)
Refer to ent 

## 2020-11-07 NOTE — Progress Notes (Addendum)
Subjective:   By signing my name below, I, Sarah Zuniga, attest that this documentation has been prepared under the direction and in the presence of Dr. Roma Schanz, DO. 11/07/2020    Patient ID: Sarah Zuniga, female    DOB: 11/01/1955, 65 y.o.   MRN: [id number]  Chief Complaint  Patient presents with   Annual Exam    Pt states fasting and needs pap    HPI Patient is in today for a comprehensive physical exam.  She complains that her thyroid is swollen. She reports that her throat gets easily hoarse after exerting her voice. She recently completed a [place] for her thyroid. Results showed previously biopsied right thyroid nodule is not well defined on the current examination. She reports her other provider, Dr. Cruzita Lederer, told her she has no issues. Patient is still concerned about her thyroid and is interested in seeing an ENT specialist for further evaluation. She has a family history of thyroid cancer and other thyroid complications from her mother and siblings.  She denies having any fever, new moles, congestion, sore throat, muscle pain, joint pain, chest pain, cough, SOB, wheezing, n/v/d, constipation, blood in stool, dysuria, frequency, hematuria, or headaches at this time. She has recently gotten the new Covid-19 vaccine and has 3 vaccines in total now. She recently received the flu vaccine.  She is UTD on vision and dental care.    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 , left   Hyperlipidemia    Hypertension    Pigment dispersion syndrome of both eyes     Past Surgical History:  Procedure Laterality Date   CATARACT EXTRACTION Right    EYE SURGERY Left 12/08/2014   cataract surgery   TONSILLECTOMY     WISDOM TOOTH EXTRACTION      Family History  Problem Relation Age of Onset   Diabetes Mother    Hypertension Mother    Hyperlipidemia Mother    Stroke Mother    Kidney disease Mother    Cancer Father         lung   Hyperlipidemia Father    Hypertension Father    Hyperlipidemia Sister    Hypertension Sister    Breast cancer Paternal Grandmother    Coronary artery disease Other        1st degree relative female <50.Marland KitchenMarland Kitchenfemale <60    Social History   Socioeconomic History   Marital status: Divorced    Spouse name: Not on file   Number of children: 0   Years of education: college 3   Highest education level: Not on file  Occupational History   Occupation: Publishing rights manager   Occupation: [employer] officer: [place]  Tobacco Use   Smoking status: Never   Smokeless tobacco: Never  Substance and Sexual Activity   Alcohol use: Yes    Comment: wine weekly   Drug use: No   Sexual activity: Not Currently    Partners: Male  Other Topics Concern   Not on file  Social History Narrative   Exercise-- 4x a week   Social Determinants of Health   Financial Resource Strain: Not on file  Food Insecurity: Not on file  Transportation Needs: Not on file  Physical Activity: Not on file  Stress: Not on file  Social Connections: Not on file  Intimate Partner Violence: Not on file    Outpatient Medications  Prior to Visit  Medication Sig Dispense Refill   cetirizine (ZYRTEC) 10 MG tablet Take 10 mg by mouth daily.       Cholecalciferol (VITAMIN D3) 2000 units TABS Take 1 tablet by mouth daily.     Cyanocobalamin (B-12) 5000 MCG SUBL Place 5,000 mcg under the tongue daily.     lisinopril (ZESTRIL) 20 MG tablet Take 1 tablet (20 mg total) by mouth daily. 90 tablet 3   Multiple Vitamin (MULTIVITAMIN) tablet Take 1 tablet by mouth daily.       simvastatin (ZOCOR) 40 MG tablet TAKE 1 TABLET BY MOUTH EVERYDAY AT BEDTIME 90 tablet 3   Magnesium 400 MG CAPS Take 1 capsule by mouth daily. (Patient not taking: Reported on 11/07/2020)     No facility-administered medications prior to visit.    No Known Allergies  Review of Systems  Constitutional:  Negative for fever and  malaise/fatigue.  HENT:  Negative for congestion and sore throat.        (+)Swollen thyroid (+)voice become hoarse easily with moderate exertion  Eyes:  Negative for blurred vision.  Respiratory:  Negative for cough, shortness of breath and wheezing.   Cardiovascular:  Negative for chest pain, palpitations and leg swelling.  Gastrointestinal:  Negative for blood in stool, constipation, diarrhea, nausea and vomiting.  Genitourinary:  Negative for dysuria, frequency and hematuria.  Musculoskeletal:  Negative for back pain, joint pain and myalgias.  Skin:  Negative for rash.       (-)New moles  Neurological:  Negative for loss of consciousness and headaches.      Objective:    Physical Exam Vitals and nursing note reviewed.  Constitutional:      General: She is not in acute distress.    Appearance: Normal appearance. She is not ill-appearing.  HENT:     Head: Normocephalic and atraumatic.     Right Ear: Tympanic membrane, ear canal and external ear normal.     Left Ear: Tympanic membrane, ear canal and external ear normal.  Eyes:     Extraocular Movements: Extraocular movements intact.     Pupils: Pupils are equal, round, and reactive to light.  Neck:     Thyroid: Thyromegaly present.     Comments: R thyromegaly on R  Cardiovascular:     Rate and Rhythm: Normal rate and regular rhythm.     Heart sounds: Normal heart sounds. No murmur heard.   No gallop.  Pulmonary:     Effort: Pulmonary effort is normal. No respiratory distress.     Breath sounds: Normal breath sounds. No wheezing or rales.  Abdominal:     General: Bowel sounds are normal. There is no distension.     Palpations: Abdomen is soft.     Tenderness: There is no abdominal tenderness. There is no guarding.  Genitourinary:    Rectum: Guaiac result negative.  Skin:    General: Skin is warm and dry.  Neurological:     Mental Status: She is alert and oriented to person, place, and time.  Psychiatric:         Behavior: Behavior normal.    BP 122/80 (BP Location: Right Arm, Patient Position: Sitting, Cuff Size: Normal)   Pulse 72   Temp (!) 97.5 F (36.4 C) (Oral)   Resp 18   Ht 5\' 6"  (1.676 m)   Wt 158 lb 12.8 oz (72 kg)   SpO2 96%   BMI 25.63 kg/m  Wt Readings from Last 3 Encounters:  11/07/20  158 lb 12.8 oz (72 kg)  09/27/20 160 lb 9.6 oz (72.8 kg)  11/01/19 149 lb 3.2 oz (67.7 kg)    Diabetic Foot Exam - Simple   No data filed    Lab Results  Component Value Date   WBC 6.4 11/01/2019   HGB 15.6 (H) 11/01/2019   HCT 47.1 (H) 11/01/2019   PLT 223 11/01/2019   GLUCOSE 69 11/01/2019   CHOL 211 (H) 11/01/2019   TRIG 249 (H) 11/01/2019   HDL 64 11/01/2019   LDLDIRECT 160.0 01/24/2016   LDLCALC 111 (H) 11/01/2019   ALT 20 11/01/2019   AST 22 11/01/2019   NA 142 11/01/2019   K 4.7 11/01/2019   CL 103 11/01/2019   CREATININE 0.77 11/01/2019   BUN 19 11/01/2019   CO2 27 11/01/2019   TSH 0.71 11/01/2019   MICROALBUR 0.3 01/07/2014    Lab Results  Component Value Date   TSH 0.71 11/01/2019   Lab Results  Component Value Date   WBC 6.4 11/01/2019   HGB 15.6 (H) 11/01/2019   HCT 47.1 (H) 11/01/2019   MCV 94.6 11/01/2019   PLT 223 11/01/2019   Lab Results  Component Value Date   NA 142 11/01/2019   K 4.7 11/01/2019   CO2 27 11/01/2019   GLUCOSE 69 11/01/2019   BUN 19 11/01/2019   CREATININE 0.77 11/01/2019   BILITOT 0.6 11/01/2019   ALKPHOS 53 03/31/2018   AST 22 11/01/2019   ALT 20 11/01/2019   PROT 7.5 11/01/2019   ALBUMIN 4.5 03/31/2018   CALCIUM 10.2 11/01/2019   GFR 77.07 03/31/2018   Lab Results  Component Value Date   CHOL 211 (H) 11/01/2019   Lab Results  Component Value Date   HDL 64 11/01/2019   Lab Results  Component Value Date   LDLCALC 111 (H) 11/01/2019   Lab Results  Component Value Date   TRIG 249 (H) 11/01/2019   Lab Results  Component Value Date   CHOLHDL 3.3 11/01/2019   No results found for: HGBA1C  Mammogram- Last  completed 10/31/2020. Results are normal. Repeat in 1 year.  Dexa- Last completed 09/26/2016. Results show she is osteopenic. Repeat in 2 years.  Pap smear- Last completed 02/27/2017. Results are normal. Repeat in 3 years. Due.  Colonoscopy- Last completed 12/20/2009.      Assessment & Plan:   Problem List Items Addressed This Visit       Unprioritized   Hoarseness    Refer to ent       Relevant Orders   Ambulatory referral to ENT   Multiple thyroid nodules    Enlarging on R  Refer to ent       Preventative health care - Primary    ghm utd Check labs  See avs      Relevant Orders   Thyroid Panel With TSH   CBC with Differential/Platelet   Comprehensive metabolic panel   Lipid panel   Cytology - PAP( [place])   Other Visit Diagnoses     Thyroid nodule       Relevant Orders   Ambulatory referral to ENT   Thyroid Panel With TSH        No orders of the defined types were placed in this encounter.   I, Dr. Roma Schanz, DO, personally preformed the services described in this documentation.  All medical record entries made by the scribe were at my direction and in my presence.  I have reviewed the chart  and discharge instructions (if applicable) and agree that the record reflects my personal performance and is accurate and complete. 11/07/2020   I,Sarah Zuniga,acting as a scribe for Ann Held, DO.,have documented all relevant documentation on the behalf of Ann Held, DO,as directed by  Ann Held, DO while in the presence of Ann Held, DO.   Ann Held, DO

## 2020-11-07 NOTE — Assessment & Plan Note (Signed)
ghm utd Check labs  See avs  

## 2020-11-07 NOTE — Assessment & Plan Note (Signed)
Enlarging on R  Refer to ent

## 2020-11-08 LAB — THYROID PANEL WITH TSH
Free Thyroxine Index: 2.4 (ref 1.4–3.8)
T3 Uptake: 33 % (ref 22–35)
T4, Total: 7.4 ug/dL (ref 5.1–11.9)
TSH: 0.76 mIU/L (ref 0.40–4.50)

## 2020-11-08 LAB — CYTOLOGY - PAP: Diagnosis: NEGATIVE

## 2020-11-29 ENCOUNTER — Other Ambulatory Visit: Payer: Self-pay | Admitting: Family Medicine

## 2020-11-29 DIAGNOSIS — E785 Hyperlipidemia, unspecified: Secondary | ICD-10-CM

## 2021-01-01 ENCOUNTER — Other Ambulatory Visit: Payer: Self-pay

## 2021-01-01 DIAGNOSIS — E785 Hyperlipidemia, unspecified: Secondary | ICD-10-CM

## 2021-01-01 MED ORDER — SIMVASTATIN 40 MG PO TABS: ORAL_TABLET | ORAL | 1 refills | Status: DC

## 2021-02-21 ENCOUNTER — Other Ambulatory Visit: Payer: Self-pay | Admitting: Family Medicine

## 2021-02-21 DIAGNOSIS — I10 Essential (primary) hypertension: Secondary | ICD-10-CM

## 2021-02-23 ENCOUNTER — Encounter: Payer: Self-pay | Admitting: Family Medicine

## 2021-02-23 DIAGNOSIS — Z1211 Encounter for screening for malignant neoplasm of colon: Secondary | ICD-10-CM

## 2021-02-27 ENCOUNTER — Encounter: Payer: Self-pay | Admitting: Gastroenterology

## 2021-03-07 ENCOUNTER — Ambulatory Visit (AMBULATORY_SURGERY_CENTER): Payer: 59 | Admitting: *Deleted

## 2021-03-07 ENCOUNTER — Other Ambulatory Visit: Payer: Self-pay

## 2021-03-07 ENCOUNTER — Encounter: Payer: Self-pay | Admitting: Gastroenterology

## 2021-03-07 VITALS — Ht 66.0 in | Wt 158.0 lb

## 2021-03-07 DIAGNOSIS — Z86010 Personal history of colonic polyps: Secondary | ICD-10-CM

## 2021-03-07 MED ORDER — PEG 3350-KCL-NA BICARB-NACL 420 G PO SOLR
4000.0000 mL | Freq: Once | ORAL | 0 refills | Status: AC
Start: 2021-03-07 — End: 2021-03-07

## 2021-03-07 NOTE — Progress Notes (Signed)

## 2021-03-21 ENCOUNTER — Ambulatory Visit (AMBULATORY_SURGERY_CENTER): Payer: Medicare Other | Admitting: Gastroenterology

## 2021-03-21 ENCOUNTER — Encounter: Payer: Self-pay | Admitting: Gastroenterology

## 2021-03-21 VITALS — BP 112/61 | HR 58 | Temp 97.5°F | Resp 20 | Ht 66.0 in | Wt 158.0 lb

## 2021-03-21 DIAGNOSIS — Z86010 Personal history of colonic polyps: Secondary | ICD-10-CM | POA: Diagnosis not present

## 2021-03-21 DIAGNOSIS — D122 Benign neoplasm of ascending colon: Secondary | ICD-10-CM

## 2021-03-21 DIAGNOSIS — K635 Polyp of colon: Secondary | ICD-10-CM

## 2021-03-21 MED ORDER — SODIUM CHLORIDE 0.9 % IV SOLN
500.0000 mL | Freq: Once | INTRAVENOUS | Status: DC
Start: 2021-03-21 — End: 2021-03-21

## 2021-03-21 NOTE — Op Note (Signed)
[facility] Patient Name: Sarah Zuniga Procedure Date: 03/21/2021 3:21 PM MRN: [id number] Endoscopist: Mauri Pole , MD Age: 66 Referring MD:  Date of Birth: 1955/11/01 Gender: Female Account #: [id number] Procedure:                Colonoscopy Indications:              High risk colon cancer surveillance: Personal                            history of colonic polyps, High risk colon cancer                            surveillance: Personal history of adenoma less than                            10 mm in size Medicines:                Monitored Anesthesia Care Procedure:                Pre-Anesthesia Assessment:                           - Prior to the procedure, a History and Physical                            was performed, and patient medications and                            allergies were reviewed. The patient's tolerance of                            previous anesthesia was also reviewed. The risks                            and benefits of the procedure and the sedation                            options and risks were discussed with the patient.                            All questions were answered, and informed consent                            was obtained. Prior Anticoagulants: The patient has                            taken no previous anticoagulant or antiplatelet                            agents. ASA Grade Assessment: II - A patient with                            mild systemic disease. After reviewing the risks  and benefits, the patient was deemed in                            satisfactory condition to undergo the procedure.                           After obtaining informed consent, the colonoscope                            was passed under direct vision. Throughout the                            procedure, the patient's blood pressure, pulse, and                            oxygen saturations were monitored  continuously. The                            Olympus PCF-H190DL [id number]) Colonoscope was                            introduced through the anus and advanced to the the                            cecum, identified by appendiceal orifice and                            ileocecal valve. The colonoscopy was performed                            without difficulty. The patient tolerated the                            procedure well. The quality of the bowel                            preparation was good. The ileocecal valve,                            appendiceal orifice, and rectum were photographed. Scope In: 3:26:12 PM Scope Out: 3:45:44 PM Scope Withdrawal Time: 0 hours 12 minutes 15 seconds  Total Procedure Duration: 0 hours 19 minutes 32 seconds  Findings:                 The perianal and digital rectal examinations were                            normal.                           Two sessile polyps were found in the ascending                            colon. The polyps were 6 to 10 mm in size. These  polyps were removed with a cold snare. Resection                            and retrieval were complete.                           A few small-mouthed diverticula were found in the                            sigmoid colon.                           Non-bleeding external and internal hemorrhoids were                            found during retroflexion. The hemorrhoids were                            medium-sized.                           The exam was otherwise without abnormality. Complications:            No immediate complications. Estimated Blood Loss:     Estimated blood loss was minimal. Impression:               - Two 6 to 10 mm polyps in the ascending colon,                            removed with a cold snare. Resected and retrieved.                           - Diverticulosis in the sigmoid colon.                           - Non-bleeding external and  internal hemorrhoids.                           - The examination was otherwise normal. Recommendation:           - Patient has a contact number available for                            emergencies. The signs and symptoms of potential                            delayed complications were discussed with the                            patient. Return to normal activities tomorrow.                            Written discharge instructions were provided to the                            patient.                           -  Resume previous diet.                           - Continue present medications.                           - Await pathology results.                           - Repeat colonoscopy in 5-10 years for surveillance                            based on pathology results. Mauri Pole, MD 03/21/2021 3:50:51 PM This report has been signed electronically.

## 2021-03-21 NOTE — Patient Instructions (Signed)
Thank you for allowing [place] to care for you today. Recommend future colonoscopy, date to be determined after polyp biopsies are complete. Resume previous diet and medications today. Return to normal activities tomorrow.     YOU HAD AN ENDOSCOPIC PROCEDURE TODAY AT [facility]:   Refer to the procedure report that was given to you for any specific questions about what was found during the examination.  If the procedure report does not answer your questions, please call your gastroenterologist to clarify.  If you requested that your care partner not be given the details of your procedure findings, then the procedure report has been included in a sealed envelope for you to review at your convenience later.  YOU SHOULD EXPECT: Some feelings of bloating in the abdomen. Passage of more gas than usual.  Walking can help get rid of the air that was put into your GI tract during the procedure and reduce the bloating. If you had a lower endoscopy (such as a colonoscopy or flexible sigmoidoscopy) you may notice spotting of blood in your stool or on the toilet paper. If you underwent a bowel prep for your procedure, you may not have a normal bowel movement for a few days.  Please Note:  You might notice some irritation and congestion in your nose or some drainage.  This is from the oxygen used during your procedure.  There is no need for concern and it should clear up in a day or so.  SYMPTOMS TO REPORT IMMEDIATELY:  Following lower endoscopy (colonoscopy or flexible sigmoidoscopy):  Excessive amounts of blood in the stool  Significant tenderness or worsening of abdominal pains  Swelling of the abdomen that is new, acute  Fever of 100F or higher    For urgent or emergent issues, a gastroenterologist can be reached at any hour by calling [phone]. Do not use MyChart messaging for urgent concerns.    DIET:  We do recommend a small meal at first, but then you may proceed to your  regular diet.  Drink plenty of fluids but you should avoid alcoholic beverages for 24 hours.  ACTIVITY:  You should plan to take it easy for the rest of today and you should NOT DRIVE or use heavy machinery until tomorrow (because of the sedation medicines used during the test).    FOLLOW UP: Our staff will call the number listed on your records 48-72 hours following your procedure to check on you and address any questions or concerns that you may have regarding the information given to you following your procedure. If we do not reach you, we will leave a message.  We will attempt to reach you two times.  During this call, we will ask if you have developed any symptoms of COVID 19. If you develop any symptoms (ie: fever, flu-like symptoms, shortness of breath, cough etc.) before then, please call [phone].  If you test positive for Covid 19 in the 2 weeks post procedure, please call and report this information to [place].    If any biopsies were taken you will be contacted by phone or by letter within the next 1-3 weeks.  Please call us at [phone] if you have not heard about the biopsies in 3 weeks.    SIGNATURES/CONFIDENTIALITY: You and/or your care partner have signed paperwork which will be entered into your electronic medical record.  These signatures attest to the fact that that the information above on your After Visit Summary has been reviewed  and is understood.  Full responsibility of the confidentiality of this discharge information lies with you and/or your care-partner.

## 2021-03-21 NOTE — Progress Notes (Signed)
Pt non-responsive, VVS, Report to RN  °

## 2021-03-21 NOTE — Progress Notes (Signed)
[facility] History and Physical   Primary Care Physician:  Carollee Herter, Alferd Apa, DO   Reason for Procedure:  History of adenomatous colon polyps  Plan:    Surveillance colonoscopy with possible interventions as needed     HPI: Sarah Zuniga is a very pleasant 66 y.o. female here for surveillance colonoscopy. Denies any nausea, vomiting, abdominal pain, melena or bright red blood per rectum  The risks and benefits as well as alternatives of endoscopic procedure(s) have been discussed and reviewed. All questions answered. The patient agrees to proceed.    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 , left   Hyperlipidemia    Hypertension    Pigment dispersion syndrome of both eyes    Thyroid disease     Past Surgical History:  Procedure Laterality Date   CATARACT EXTRACTION Right    EYE SURGERY Left 12/08/2014   cataract surgery   TONSILLECTOMY     WISDOM TOOTH EXTRACTION      Prior to Admission medications   Medication Sig Start Date End Date Taking? Authorizing Provider  cetirizine (ZYRTEC) 10 MG tablet Take 10 mg by mouth daily.     Yes [provider]  lisinopril (ZESTRIL) 20 MG tablet TAKE 1 TABLET BY MOUTH EVERY DAY 02/22/21  Yes Ann Held, DO  simvastatin (ZOCOR) 40 MG tablet Take 1 tablet by mouth everyday at bedtime 01/01/21  Yes Roma Schanz R, DO  Cholecalciferol (VITAMIN D3) 2000 units TABS Take 1 tablet by mouth daily. Patient not taking: Reported on 03/07/2021    [provider]  Cyanocobalamin (B-12) 5000 MCG SUBL Place 5,000 mcg under the tongue daily. Patient not taking: Reported on 03/07/2021    [provider]  Magnesium 400 MG CAPS Take 1 capsule by mouth daily. Patient not taking: Reported on 11/07/2020    [provider]  Multiple Vitamin (MULTIVITAMIN) tablet Take 1 tablet by mouth daily.   Patient not taking: Reported on  03/07/2021    [provider]    Current Outpatient Medications  Medication Sig Dispense Refill   cetirizine (ZYRTEC) 10 MG tablet Take 10 mg by mouth daily.       lisinopril (ZESTRIL) 20 MG tablet TAKE 1 TABLET BY MOUTH EVERY DAY 90 tablet 1   simvastatin (ZOCOR) 40 MG tablet Take 1 tablet by mouth everyday at bedtime 90 tablet 1   Cholecalciferol (VITAMIN D3) 2000 units TABS Take 1 tablet by mouth daily. (Patient not taking: Reported on 03/07/2021)     Cyanocobalamin (B-12) 5000 MCG SUBL Place 5,000 mcg under the tongue daily. (Patient not taking: Reported on 03/07/2021)     Magnesium 400 MG CAPS Take 1 capsule by mouth daily. (Patient not taking: Reported on 11/07/2020)     Multiple Vitamin (MULTIVITAMIN) tablet Take 1 tablet by mouth daily.   (Patient not taking: Reported on 03/07/2021)     Current Facility-Administered Medications  Medication Dose Route Frequency Provider Last Rate Last Admin   0.9 %  sodium chloride infusion  500 mL Intravenous Once Mauri Pole, MD        Allergies as of 03/21/2021   (No Known Allergies)    Family History  Problem Relation Age of Onset   Diabetes Mother    Hypertension Mother    Hyperlipidemia Mother    Stroke Mother    Kidney disease Mother    Colon polyps  Father    Cancer Father        lung   Hyperlipidemia Father    Hypertension Father    Hyperlipidemia Sister    Hypertension Sister    Esophageal cancer Maternal Grandmother    Breast cancer Paternal Grandmother    Coronary artery disease Other        1st degree relative female <50.Marland KitchenMarland Kitchenfemale <60   Colon cancer Neg Hx    Rectal cancer Neg Hx    Stomach cancer Neg Hx     Social History   Socioeconomic History   Marital status: Divorced    Spouse name: Not on file   Number of children: 0   Years of education: college 3   Highest education level: Not on file  Occupational History   Occupation: Publishing rights manager   Occupation: [employer] officer: [place]   Tobacco Use   Smoking status: Never   Smokeless tobacco: Never  Substance and Sexual Activity   Alcohol use: Yes    Comment: wine weekly   Drug use: No   Sexual activity: Not Currently    Partners: Male  Other Topics Concern   Not on file  Social History Narrative   Exercise-- 4x a week   Social Determinants of Health   Financial Resource Strain: Not on file  Food Insecurity: Not on file  Transportation Needs: Not on file  Physical Activity: Not on file  Stress: Not on file  Social Connections: Not on file  Intimate Partner Violence: Not on file    Review of Systems:  All other review of systems negative except as mentioned in the HPI.  Physical Exam: Vital signs in last 24 hours: BP 133/68    Pulse 67    Temp (!) 97.5 F (36.4 C) (Temporal)    Ht 5\' 6"  (1.676 m)    Wt 158 lb (71.7 kg)    SpO2 100%    BMI 25.50 kg/m  General:   Alert, NAD Lungs:  Clear .   Heart:  Regular rate and rhythm Abdomen:  Soft, nontender and nondistended. Neuro/Psych:  Alert and cooperative. Normal mood and affect. A and O x 3  Reviewed labs, radiology imaging, old records and pertinent past GI work up  Patient is appropriate for planned procedure(s) and anesthesia in an ambulatory setting   K. Denzil Magnuson , MD [phone]

## 2021-03-21 NOTE — Progress Notes (Signed)
Called to room to assist during endoscopic procedure.  Patient ID and intended procedure confirmed with present staff. Received instructions for my participation in the procedure from the performing physician.  

## 2021-03-21 NOTE — Progress Notes (Signed)
Pt's states no medical or surgical changes since previsit or office visit. 

## 2021-03-23 ENCOUNTER — Telehealth: Payer: Self-pay | Admitting: *Deleted

## 2021-03-23 NOTE — Telephone Encounter (Signed)
°  Follow up Call-  Call back number 03/21/2021  Post procedure Call Back phone  # [phone]  Permission to leave phone message Yes  Some recent data might be hidden     Patient questions:  Do you have a fever, pain , or abdominal swelling? Yes.   Pain Score  0 *  Have you tolerated food without any problems? Yes.    Have you been able to return to your normal activities? Yes.    Do you have any questions about your discharge instructions: Diet   No. Medications  No. Follow up visit  No.  Do you have questions or concerns about your Care? No.  Actions: * If pain score is 4 or above: No action needed, pain <4.  Have you developed a fever since your procedure? no  2.   Have you had an respiratory symptoms (SOB or cough) since your procedure? no  3.   Have you tested positive for COVID 19 since your procedure no  4.   Have you had any family members/close contacts diagnosed with the COVID 19 since your procedure?  no   If yes to any of these questions please route to Joylene John, RN and Joella Prince, RN

## 2021-03-27 ENCOUNTER — Encounter: Payer: Self-pay | Admitting: Gastroenterology

## 2021-04-13 ENCOUNTER — Encounter: Payer: Self-pay | Admitting: Family Medicine

## 2021-04-13 ENCOUNTER — Ambulatory Visit (HOSPITAL_BASED_OUTPATIENT_CLINIC_OR_DEPARTMENT_OTHER): Admission: RE | Admit: 2021-04-13 | Payer: Medicare Other | Source: Ambulatory Visit

## 2021-04-13 ENCOUNTER — Ambulatory Visit (INDEPENDENT_AMBULATORY_CARE_PROVIDER_SITE_OTHER): Payer: Medicare Other | Admitting: Family Medicine

## 2021-04-13 ENCOUNTER — Ambulatory Visit (HOSPITAL_BASED_OUTPATIENT_CLINIC_OR_DEPARTMENT_OTHER): Payer: Medicare Other

## 2021-04-13 VITALS — BP 112/70 | HR 64 | Temp 98.0°F | Resp 18 | Ht 66.0 in | Wt 157.2 lb

## 2021-04-13 DIAGNOSIS — R102 Pelvic and perineal pain: Secondary | ICD-10-CM | POA: Diagnosis not present

## 2021-04-13 DIAGNOSIS — R1033 Periumbilical pain: Secondary | ICD-10-CM | POA: Diagnosis not present

## 2021-04-13 LAB — POC URINALSYSI DIPSTICK (AUTOMATED)
Bilirubin, UA: NEGATIVE
Blood, UA: NEGATIVE
Glucose, UA: NEGATIVE
Ketones, UA: NEGATIVE
Leukocytes, UA: NEGATIVE
Nitrite, UA: NEGATIVE
Protein, UA: NEGATIVE
Spec Grav, UA: 1.015 (ref 1.010–1.025)
Urobilinogen, UA: 0.2 E.U./dL
pH, UA: 5 (ref 5.0–8.0)

## 2021-04-13 LAB — CBC WITH DIFFERENTIAL/PLATELET
Basophils Absolute: 0 10*3/uL (ref 0.0–0.1)
Basophils Relative: 0.3 % (ref 0.0–3.0)
Eosinophils Absolute: 0.1 10*3/uL (ref 0.0–0.7)
Eosinophils Relative: 1.3 % (ref 0.0–5.0)
HCT: 43.4 % (ref 36.0–46.0)
Hemoglobin: 14.7 g/dL (ref 12.0–15.0)
Lymphocytes Relative: 27.2 % (ref 12.0–46.0)
Lymphs Abs: 1.7 10*3/uL (ref 0.7–4.0)
MCHC: 33.8 g/dL (ref 30.0–36.0)
MCV: 93.3 fl (ref 78.0–100.0)
Monocytes Absolute: 0.4 10*3/uL (ref 0.1–1.0)
Monocytes Relative: 6.4 % (ref 3.0–12.0)
Neutro Abs: 4.1 10*3/uL (ref 1.4–7.7)
Neutrophils Relative %: 64.8 % (ref 43.0–77.0)
Platelets: 204 10*3/uL (ref 150.0–400.0)
RBC: 4.65 Mil/uL (ref 3.87–5.11)
RDW: 13.1 % (ref 11.5–15.5)
WBC: 6.4 10*3/uL (ref 4.0–10.5)

## 2021-04-13 LAB — COMPREHENSIVE METABOLIC PANEL
ALT: 20 U/L (ref 0–35)
AST: 23 U/L (ref 0–37)
Albumin: 4.6 g/dL (ref 3.5–5.2)
Alkaline Phosphatase: 50 U/L (ref 39–117)
BUN: 19 mg/dL (ref 6–23)
CO2: 25 mEq/L (ref 19–32)
Calcium: 9.7 mg/dL (ref 8.4–10.5)
Chloride: 105 mEq/L (ref 96–112)
Creatinine, Ser: 0.85 mg/dL (ref 0.40–1.20)
GFR: 72 mL/min (ref >60.00)
Glucose, Bld: 89 mg/dL (ref 70–99)
Potassium: 4.4 mEq/L (ref 3.5–5.1)
Sodium: 139 mEq/L (ref 135–145)
Total Bilirubin: 0.6 mg/dL (ref 0.2–1.2)
Total Protein: 7 g/dL (ref 6.0–8.3)

## 2021-04-13 MED ORDER — MELOXICAM 7.5 MG PO TABS: 7.5000 mg | ORAL_TABLET | Freq: Every day | ORAL | 0 refills | Status: DC

## 2021-04-13 NOTE — Assessment & Plan Note (Signed)
Check US pelvis  ?Check labs  ?ua normal  ?

## 2021-04-13 NOTE — Assessment & Plan Note (Signed)
?   Etiology ?Check labs  ?Check [place] ?

## 2021-04-13 NOTE — Patient Instructions (Signed)
Pelvic Pain, Female ?Pelvic pain is pain in your lower abdomen, below your belly button and between your hips. The pain may start suddenly (be acute), keep coming back (be recurring), or last a long time (become chronic). Pelvic pain that lasts longer than 6 months is considered chronic. ?Pelvic pain may affect your: ?Reproductive organs. ?Urinary system. ?Digestive tract. ?Musculoskeletal system. ?There are many potential causes of pelvic pain. Sometimes, the pain can be a result of digestive or urinary conditions, strained muscles or ligaments, or reproductive conditions. Sometimes the cause of pelvic pain is not known. ?Follow these instructions at home: ? ?Take over-the-counter and prescription medicines only as told by your health care provider. ?Rest as told by your health care provider. ?Do not have sex if it hurts. ?Keep a journal of your pelvic pain. Write down: ?When the pain started. ?Where the pain is located. ?What seems to make the pain better or worse, such as food or your monthly period (menstrual cycle). ?Any symptoms you have along with the pain. ?Keep all follow-up visits. This is important. ?Contact a health care provider if: ?Medicine does not help your pain, or your pain comes back. ?You have new symptoms. ?You have abnormal vaginal discharge or bleeding, including bleeding after menopause. ?You have a fever or chills. ?You are constipated. ?You have blood in your urine or stool (feces). ?You have foul-smelling urine. ?You feel weak or light-headed. ?Get help right away if: ?You have sudden severe pain. ?Your pain gets steadily worse. ?You have severe pain along with fever, nausea, vomiting, or excessive sweating. ?You lose consciousness. ?These symptoms may represent a serious problem that is an emergency. Do not wait to see if the symptoms will go away. Get medical help right away. Call your local emergency services (911 in the [place] Do not drive yourself to the hospital. ?Summary ?Pelvic  pain is pain in your lower abdomen, below your belly button and between your hips. ?There are many potential causes of pelvic pain. ?Keep a journal of your pelvic pain. ?This information is not intended to replace advice given to you by your health care provider. Make sure you discuss any questions you have with your health care provider. ?Document Revised: 05/30/2020 Document Reviewed: 05/30/2020 ?Elsevier Patient Education ? [place]. ? ?

## 2021-04-13 NOTE — Progress Notes (Signed)
Subjective:   By signing my name below, I, Zite Okoli, attest that this documentation has been prepared under the direction and in the presence of Ann Held, DO. 04/13/2021   Patient ID: Sarah Zuniga, female    DOB: July 14, 1955, 66 y.o.   MRN: [id number]  Chief Complaint  Patient presents with   Pelvic Pain    Pt states sxs started in November. Pt states having a constant pain and occ nausea and some tenderness.     HPI Patient is in today for an office visit.  She went hiking in November 2022 and when she got to the bottom of the trail, she started experiencing pelvic pain. She adds she is also tender in her abdomen. She reports the pain is intermittent and often starts when she is at the gym doing hip exercises. Notes the pain is often accompanied with nausea. She has been using Aleve to manage the pain. She reports that the pain is reduced at this time compared to when it first started.  Denies vaginal discharge.  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 , left   Hyperlipidemia    Hypertension    Pigment dispersion syndrome of both eyes    Thyroid disease     Past Surgical History:  Procedure Laterality Date   CATARACT EXTRACTION Right    EYE SURGERY Left 12/08/2014   cataract surgery   TONSILLECTOMY     WISDOM TOOTH EXTRACTION      Family History  Problem Relation Age of Onset   Diabetes Mother    Hypertension Mother    Hyperlipidemia Mother    Stroke Mother    Kidney disease Mother    Colon polyps Father    Cancer Father        lung   Hyperlipidemia Father    Hypertension Father    Hyperlipidemia Sister    Hypertension Sister    Esophageal cancer Maternal Grandmother    Breast cancer Paternal Grandmother    Coronary artery disease Other        1st degree relative female <50.Marland KitchenMarland Kitchenfemale <60   Colon cancer Neg Hx    Rectal cancer Neg Hx    Stomach cancer Neg Hx     Social History    Socioeconomic History   Marital status: Divorced    Spouse name: Not on file   Number of children: 0   Years of education: college 3   Highest education level: Not on file  Occupational History   Occupation: Publishing rights manager   Occupation: [employer] officer: [place]  Tobacco Use   Smoking status: Never   Smokeless tobacco: Never  Substance and Sexual Activity   Alcohol use: Yes    Comment: wine weekly   Drug use: No   Sexual activity: Not Currently    Partners: Male  Other Topics Concern   Not on file  Social History Narrative   Exercise-- 4x a week   Social Determinants of Health   Financial Resource Strain: Not on file  Food Insecurity: Not on file  Transportation Needs: Not on file  Physical Activity: Not on file  Stress: Not on file  Social Connections: Not on file  Intimate Partner Violence: Not on file    Outpatient Medications Prior to Visit  Medication Sig Dispense Refill   cetirizine (ZYRTEC) 10 MG tablet Take 10 mg by  mouth daily.       Cholecalciferol (VITAMIN D3) 2000 units TABS Take 1 tablet by mouth daily.     Cyanocobalamin (B-12) 5000 MCG SUBL Place 5,000 mcg under the tongue daily.     lisinopril (ZESTRIL) 20 MG tablet TAKE 1 TABLET BY MOUTH EVERY DAY 90 tablet 1   Magnesium 400 MG CAPS Take 1 capsule by mouth daily.     Multiple Vitamin (MULTIVITAMIN) tablet Take 1 tablet by mouth daily.     simvastatin (ZOCOR) 40 MG tablet Take 1 tablet by mouth everyday at bedtime 90 tablet 1   No facility-administered medications prior to visit.    No Known Allergies  Review of Systems  Constitutional:  Negative for fever.  HENT:  Negative for congestion, ear pain, hearing loss, sinus pain and sore throat.   Eyes:  Negative for blurred vision and pain.  Respiratory:  Negative for cough, sputum production, shortness of breath and wheezing.   Cardiovascular:  Negative for chest pain and palpitations.  Gastrointestinal:  Positive for  abdominal pain. Negative for blood in stool, constipation, diarrhea, nausea and vomiting.  Genitourinary:  Negative for dysuria, frequency, hematuria and urgency.  Musculoskeletal:  Negative for back pain, falls and myalgias.       (+) pelvic pain  Neurological:  Negative for dizziness, sensory change, loss of consciousness, weakness and headaches.  Endo/Heme/Allergies:  Negative for environmental allergies. Does not bruise/bleed easily.  Psychiatric/Behavioral:  Negative for depression and suicidal ideas. The patient is not nervous/anxious and does not have insomnia.       Objective:    Physical Exam Vitals and nursing note reviewed.  Constitutional:      General: She is not in acute distress.    Appearance: Normal appearance. She is not ill-appearing.  HENT:     Head: Normocephalic and atraumatic.     Right Ear: External ear normal.     Left Ear: External ear normal.  Eyes:     Extraocular Movements: Extraocular movements intact.     Pupils: Pupils are equal, round, and reactive to light.  Cardiovascular:     Rate and Rhythm: Normal rate and regular rhythm.     Pulses: Normal pulses.     Heart sounds: Normal heart sounds. No murmur heard.   No gallop.  Pulmonary:     Effort: Pulmonary effort is normal. No respiratory distress.     Breath sounds: Normal breath sounds. No wheezing, rhonchi or rales.  Abdominal:     General: Bowel sounds are normal. There is no distension.     Palpations: Abdomen is soft. There is no mass.     Tenderness: There is abdominal tenderness in the right lower quadrant, epigastric area and left lower quadrant. There is no guarding or rebound.     Hernia: No hernia is present.     Comments: Tender on palpation in lower epigastric region   Genitourinary:    Vagina: Normal.  Musculoskeletal:     Cervical back: Normal range of motion and neck supple.  Lymphadenopathy:     Cervical: No cervical adenopathy.  Skin:    General: Skin is warm and dry.   Neurological:     Mental Status: She is alert and oriented to person, place, and time.  Psychiatric:        Behavior: Behavior normal.    BP 112/70 (BP Location: Left Arm, Patient Position: Sitting, Cuff Size: Normal)    Pulse 64    Temp 98 F (36.7 C) (Oral)  Resp 18    Ht '5\' 6"'$  (1.676 m)    Wt 157 lb 3.2 oz (71.3 kg)    SpO2 98%    BMI 25.37 kg/m  Wt Readings from Last 3 Encounters:  04/13/21 157 lb 3.2 oz (71.3 kg)  03/21/21 158 lb (71.7 kg)  03/07/21 158 lb (71.7 kg)    Diabetic Foot Exam - Simple   No data filed    Lab Results  Component Value Date   WBC 5.2 11/07/2020   HGB 15.3 (H) 11/07/2020   HCT 44.6 11/07/2020   PLT 184.0 11/07/2020   GLUCOSE 84 11/07/2020   CHOL 180 11/07/2020   TRIG 148.0 11/07/2020   HDL 56.30 11/07/2020   LDLDIRECT 160.0 01/24/2016   LDLCALC 94 11/07/2020   ALT 19 11/07/2020   AST 23 11/07/2020   NA 139 11/07/2020   K 4.8 11/07/2020   CL 103 11/07/2020   CREATININE 0.76 11/07/2020   BUN 15 11/07/2020   CO2 28 11/07/2020   TSH 0.76 11/07/2020   MICROALBUR 0.3 01/07/2014    Lab Results  Component Value Date   TSH 0.76 11/07/2020   Lab Results  Component Value Date   WBC 5.2 11/07/2020   HGB 15.3 (H) 11/07/2020   HCT 44.6 11/07/2020   MCV 93.4 11/07/2020   PLT 184.0 11/07/2020   Lab Results  Component Value Date   NA 139 11/07/2020   K 4.8 11/07/2020   CO2 28 11/07/2020   GLUCOSE 84 11/07/2020   BUN 15 11/07/2020   CREATININE 0.76 11/07/2020   BILITOT 0.6 11/07/2020   ALKPHOS 51 11/07/2020   AST 23 11/07/2020   ALT 19 11/07/2020   PROT 7.1 11/07/2020   ALBUMIN 4.6 11/07/2020   CALCIUM 10.0 11/07/2020   GFR 82.60 11/07/2020   Lab Results  Component Value Date   CHOL 180 11/07/2020   Lab Results  Component Value Date   HDL 56.30 11/07/2020   Lab Results  Component Value Date   LDLCALC 94 11/07/2020   Lab Results  Component Value Date   TRIG 148.0 11/07/2020   Lab Results  Component Value Date    CHOLHDL 3 11/07/2020   No results found for: HGBA1C     Assessment & Plan:   Problem List Items Addressed This Visit       Unprioritized   Pelvic pain - Primary    Check US pelvis  Check labs  ua normal       Relevant Medications   meloxicam (MOBIC) 7.5 MG tablet   Other Relevant Orders   US PELVIS (TRANSABDOMINAL ONLY)   CBC with Differential/Platelet   Comprehensive metabolic panel   POCT Urinalysis Dipstick (Automated) (Completed)   Periumbilical abdominal pain    ? Etiology Check labs  Check [place]      Relevant Orders   US Abdomen Complete   CBC with Differential/Platelet   Comprehensive metabolic panel   POCT Urinalysis Dipstick (Automated) (Completed)    Meds ordered this encounter  Medications   meloxicam (MOBIC) 7.5 MG tablet    Sig: Take 1 tablet (7.5 mg total) by mouth daily.    Dispense:  30 tablet    Refill:  0    I,Zite Okoli,acting as a scribe for [employer], DO.,have documented all relevant documentation on the behalf of Ann Held, DO,as directed by  Ann Held, DO while in the presence of Ann Held, DO.   I, Rosalita Chessman  Chase, DO., personally preformed the services described in this documentation.  All medical record entries made by the scribe were at my direction and in my presence.  I have reviewed the chart and discharge instructions (if applicable) and agree that the record reflects my personal performance and is accurate and complete. 04/13/2021

## 2021-04-14 ENCOUNTER — Ambulatory Visit (HOSPITAL_BASED_OUTPATIENT_CLINIC_OR_DEPARTMENT_OTHER)
Admission: RE | Admit: 2021-04-14 | Discharge: 2021-04-14 | Disposition: A | Discharge status: Home / Self Care | Payer: Medicare Other | Source: Ambulatory Visit | Attending: Family Medicine | Admitting: Family Medicine

## 2021-04-14 ENCOUNTER — Other Ambulatory Visit: Payer: Self-pay | Admitting: Family Medicine

## 2021-04-14 DIAGNOSIS — R102 Pelvic and perineal pain unspecified side: Secondary | ICD-10-CM

## 2021-04-14 DIAGNOSIS — R1033 Periumbilical pain: Secondary | ICD-10-CM | POA: Diagnosis not present

## 2021-04-14 DIAGNOSIS — K7689 Other specified diseases of liver: Secondary | ICD-10-CM | POA: Diagnosis not present

## 2021-04-14 DIAGNOSIS — Z780 Asymptomatic menopausal state: Secondary | ICD-10-CM | POA: Diagnosis not present

## 2021-04-14 DIAGNOSIS — K760 Fatty (change of) liver, not elsewhere classified: Secondary | ICD-10-CM | POA: Diagnosis not present

## 2021-04-16 ENCOUNTER — Other Ambulatory Visit: Payer: Self-pay | Admitting: Family Medicine

## 2021-04-16 DIAGNOSIS — R1900 Intra-abdominal and pelvic swelling, mass and lump, unspecified site: Secondary | ICD-10-CM

## 2021-04-17 ENCOUNTER — Encounter: Payer: Self-pay | Admitting: Family Medicine

## 2021-05-04 ENCOUNTER — Ambulatory Visit
Admission: RE | Admit: 2021-05-04 | Discharge: 2021-05-04 | Disposition: A | Discharge status: Home / Self Care | Payer: Medicare Other | Source: Ambulatory Visit | Attending: Family Medicine | Admitting: Family Medicine

## 2021-05-04 DIAGNOSIS — R1900 Intra-abdominal and pelvic swelling, mass and lump, unspecified site: Secondary | ICD-10-CM

## 2021-05-04 DIAGNOSIS — K7689 Other specified diseases of liver: Secondary | ICD-10-CM | POA: Diagnosis not present

## 2021-05-04 MED ORDER — GADOBENATE DIMEGLUMINE 529 MG/ML IV SOLN
14.0000 mL | Freq: Once | INTRAVENOUS | Status: AC | PRN
  Administered 2021-05-04: 14 mL via INTRAVENOUS

## 2021-05-06 ENCOUNTER — Encounter: Payer: Self-pay | Admitting: Family Medicine

## 2021-05-16 DIAGNOSIS — H5213 Myopia, bilateral: Secondary | ICD-10-CM | POA: Diagnosis not present

## 2021-05-16 DIAGNOSIS — Z961 Presence of intraocular lens: Secondary | ICD-10-CM | POA: Diagnosis not present

## 2021-05-29 ENCOUNTER — Other Ambulatory Visit: Payer: Self-pay | Admitting: Family Medicine

## 2021-05-29 DIAGNOSIS — I10 Essential (primary) hypertension: Secondary | ICD-10-CM

## 2021-06-02 ENCOUNTER — Other Ambulatory Visit: Payer: Self-pay | Admitting: Family Medicine

## 2021-06-02 DIAGNOSIS — E785 Hyperlipidemia, unspecified: Secondary | ICD-10-CM

## 2021-08-29 DIAGNOSIS — M8588 Other specified disorders of bone density and structure, other site: Secondary | ICD-10-CM | POA: Diagnosis not present

## 2021-08-31 ENCOUNTER — Other Ambulatory Visit: Payer: Self-pay | Admitting: Family Medicine

## 2021-08-31 DIAGNOSIS — I10 Essential (primary) hypertension: Secondary | ICD-10-CM

## 2021-10-19 ENCOUNTER — Other Ambulatory Visit: Payer: Self-pay | Admitting: Family Medicine

## 2021-10-19 DIAGNOSIS — Z1231 Encounter for screening mammogram for malignant neoplasm of breast: Secondary | ICD-10-CM

## 2021-11-16 ENCOUNTER — Ambulatory Visit
Admission: RE | Admit: 2021-11-16 | Discharge: 2021-11-16 | Disposition: A | Discharge status: Home / Self Care | Payer: Medicare Other | Source: Ambulatory Visit | Attending: Family Medicine | Admitting: Family Medicine

## 2021-11-16 DIAGNOSIS — Z1231 Encounter for screening mammogram for malignant neoplasm of breast: Secondary | ICD-10-CM | POA: Diagnosis not present

## 2021-11-23 ENCOUNTER — Other Ambulatory Visit: Payer: Self-pay | Admitting: Family Medicine

## 2021-11-23 DIAGNOSIS — E785 Hyperlipidemia, unspecified: Secondary | ICD-10-CM

## 2021-12-31 DIAGNOSIS — H26493 Other secondary cataract, bilateral: Secondary | ICD-10-CM | POA: Diagnosis not present

## 2022-01-02 DIAGNOSIS — H26492 Other secondary cataract, left eye: Secondary | ICD-10-CM | POA: Diagnosis not present

## 2022-01-07 ENCOUNTER — Telehealth: Payer: Self-pay | Admitting: Family Medicine

## 2022-01-07 NOTE — Telephone Encounter (Signed)
Copied from [place] [id number]. Topic: Medicare AWV >> Jan 07, 2022  1:41 PM Gillis Santa wrote: Reason for CRM: lvm PATIENT TO CALL [phone] TO SCHEDULE AWVI [place]

## 2022-01-09 DIAGNOSIS — H26491 Other secondary cataract, right eye: Secondary | ICD-10-CM | POA: Diagnosis not present

## 2022-01-25 DIAGNOSIS — H26491 Other secondary cataract, right eye: Secondary | ICD-10-CM | POA: Diagnosis not present

## 2022-01-30 ENCOUNTER — Ambulatory Visit (INDEPENDENT_AMBULATORY_CARE_PROVIDER_SITE_OTHER): Payer: Medicare Other

## 2022-01-30 VITALS — Wt 157.0 lb

## 2022-01-30 DIAGNOSIS — Z0000 Encounter for general adult medical examination without abnormal findings: Secondary | ICD-10-CM

## 2022-01-30 NOTE — Progress Notes (Addendum)
I connected with  Sarah Zuniga on 01/30/22 by a audio enabled telemedicine application and verified that I am speaking with the correct person using two identifiers.  Patient Location: Home  Provider Location: Home Office  I discussed the limitations of evaluation and management by telemedicine. The patient expressed understanding and agreed to proceed.   Subjective:   Sarah Zuniga is a 66 y.o. female who presents for an Initial Medicare Annual Wellness Visit.  Review of Systems     Cardiac Risk Factors include: advanced age (>23mn, >>28women);dyslipidemia;hypertension     Objective:    Today's Vitals   01/30/22 0858  Weight: 157 lb (71.2 kg)   Body mass index is 25.34 kg/m.     01/30/2022    9:05 AM  Advanced Directives  Does Patient Have a Medical Advance Directive? No  Would patient like information on creating a medical advance directive? No - Patient declined    Current Medications (verified) Outpatient Encounter Medications as of 01/30/2022  Medication Sig   cetirizine (ZYRTEC) 10 MG tablet Take 10 mg by mouth daily.     Cholecalciferol (VITAMIN D3) 2000 units TABS Take 1 tablet by mouth daily.   COMIRNATY SUSP injection    Cyanocobalamin (B-12) 5000 MCG SUBL Place 5,000 mcg under the tongue daily.   FLUAD QUADRIVALENT 0.5 ML injection    lisinopril (ZESTRIL) 20 MG tablet TAKE 1 TABLET BY MOUTH EVERY DAY   Magnesium 400 MG CAPS Take 1 capsule by mouth daily.   Multiple Vitamin (MULTIVITAMIN) tablet Take 1 tablet by mouth daily.   simvastatin (ZOCOR) 40 MG tablet TAKE 1 TABLET BY MOUTH EVERYDAY AT BEDTIME   [DISCONTINUED] meloxicam (MOBIC) 7.5 MG tablet Take 1 tablet (7.5 mg total) by mouth daily.   No facility-administered encounter medications on file as of 01/30/2022.    Allergies (verified) Patient has no known allergies.   History: 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 , left    Hyperlipidemia    Hypertension    Pigment dispersion syndrome of both eyes    Thyroid disease    Past Surgical History:  Procedure Laterality Date   CATARACT EXTRACTION Right    EYE SURGERY Left 12/08/2014   cataract surgery   TONSILLECTOMY     WISDOM TOOTH EXTRACTION     Family History  Problem Relation Age of Onset   Diabetes Mother    Hypertension Mother    Hyperlipidemia Mother    Stroke Mother    Kidney disease Mother    Colon polyps Father    Cancer Father        lung   Hyperlipidemia Father    Hypertension Father    Hyperlipidemia Sister    Hypertension Sister    Esophageal cancer Maternal Grandmother    Breast cancer Paternal Grandmother    Coronary artery disease Other        1st degree relative female <50..Marland KitchenMarland Kitchenemale <60   Colon cancer Neg Hx    Rectal cancer Neg Hx    Stomach cancer Neg Hx    Social History   Socioeconomic History   Marital status: Divorced    Spouse name: Not on file   Number of children: 0   Years of education: college 3   Highest education level: Not on file  Occupational History   Occupation: rPublishing rights manager  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  Tobacco Use   Smoking status: Never   Smokeless tobacco: Never  Substance and Sexual Activity   Alcohol use: Yes    Comment: wine weekly   Drug use: No   Sexual activity: Not Currently    Partners: Male  Other Topics Concern   Not on file  Social History Narrative   Exercise-- 4x a week   Social Determinants of Health   Financial Resource Strain: Low Risk  (01/30/2022)   Overall Financial Resource Strain (CARDIA)    Difficulty of Paying Living Expenses: Not hard at all  Food Insecurity: No Food Insecurity (01/30/2022)   Hunger Vital Sign    Worried About Running Out of Food in the Last Year: Never true    Ran Out of Food in the Last Year: Never true  Transportation Needs: No Transportation Needs (01/30/2022)   PRAPARE - Radiographer, therapeutic (Medical): No    Lack of Transportation (Non-Medical): No  Physical Activity: Sufficiently Active (01/30/2022)   Exercise Vital Sign    Days of Exercise per Week: 5 days    Minutes of Exercise per Session: 40 min  Stress: No Stress Concern Present (01/30/2022)   Sarah Zuniga    Feeling of Stress : Not at all  Social Connections: Moderately Integrated (01/30/2022)   Social Connection and Isolation Panel [NHANES]    Frequency of Communication with Friends and Family: More than three times a week    Frequency of Social Gatherings with Friends and Family: More than three times a week    Attends Religious Services: 1 to 4 times per year    Active Member of [employer] or Organizations: Yes    Attends Archivist Meetings: 1 to 4 times per year    Marital Status: Divorced    Tobacco Counseling Counseling given: Not Answered   Clinical Intake:  Pre-visit preparation completed: Yes  Pain : No/denies pain     BMI - recorded: 25.34 Nutritional Status: BMI 25 -29 Overweight Nutritional Risks: None Diabetes: No  How often do you need to have someone help you when you read instructions, pamphlets, or other written materials from your doctor or pharmacy?: 1 - Never  Diabetic?no  Interpreter Needed?: No  Information entered by :: Sarah Rakes, Sarah Zuniga   Activities of Daily Living    01/30/2022    9:06 AM 01/24/2022   10:53 AM  In your present state of health, do you have any difficulty performing the following activities:  Hearing? 0 0  Vision? 0 0  Difficulty concentrating or making decisions? 0 0  Walking or climbing stairs? 0 0  Dressing or bathing? 0 0  Doing errands, shopping? 0 0  Preparing Food and eating ? N N  Using the Toilet? N N  In the past six months, have you accidently leaked urine? N N  Do you have problems with loss of bowel control? N N  Managing your Medications? N N   Managing your Finances? N N  Housekeeping or managing your Housekeeping? N N    Patient Care Team: Carollee Herter, Alferd Apa, DO as PCP - General Luberta Mutter, MD as Consulting Physician (Ophthalmology) Sable Feil, MD as Consulting Physician (Gastroenterology)  Indicate any recent Medical Services you may have received from other than [facility] providers in the past year (date may be approximate).     Assessment:   This is a routine wellness examination for Yarelin.  Hearing/Vision screen Hearing Screening - Comments:: Pt denies any hearing issues  Vision Screening - Comments:: Pt follows up with Dr Ellie Lunch for annul ye exams   Dietary issues and exercise activities discussed: Current Exercise Habits: Home exercise routine, Type of exercise: walking;strength training/weights, Time (Minutes): 45, Frequency (Times/Week): 5, Weekly Exercise (Minutes/Week): 225   Goals Addressed             This Visit's Progress    Patient Stated       Stay fit and active        Depression Screen    01/30/2022    9:02 AM 11/07/2020   10:27 AM 11/01/2019    2:54 PM 02/27/2017    2:48 PM 01/30/2016    1:49 PM 09/19/2014    5:58 PM 12/07/2012    8:36 AM  PHQ 2/9 Scores  PHQ - 2 Score 0 0 0 0 0 0 0    Fall Risk    01/30/2022    9:06 AM 01/24/2022   10:53 AM 11/07/2020   10:27 AM 01/30/2016    1:49 PM  Fall Risk   Falls in the past year? 0 0 0 No  Number falls in past yr: 0 0 0   Injury with Fall? 0 0 0   Risk for fall due to : Impaired vision     Follow up Falls prevention discussed  Falls evaluation completed     FALL RISK PREVENTION PERTAINING TO THE HOME:  Any stairs in or around the home? Yes  If so, are there any without handrails? No  Home free of loose throw rugs in walkways, pet beds, electrical cords, etc? Yes  Adequate lighting in your home to reduce risk of falls? Yes   ASSISTIVE DEVICES UTILIZED TO PREVENT FALLS:  Life alert? No  Use of a cane, walker or w/c?  No  Grab bars in the bathroom? Yes  Shower chair or bench in shower? No  Elevated toilet seat or a handicapped toilet? No   TIMED UP AND GO:  Was the test performed? No .   Cognitive Function:        01/30/2022    9:07 AM  6CIT Screen  What Year? 0 points  What month? 0 points  What time? 0 points  Count back from 20 0 points  Months in reverse 2 points  Repeat phrase 0 points  Total Score 2 points    Immunizations Immunization History  Administered Date(s) Administered   Fluad Quad(high Dose 65+) 10/21/2021   Influenza Inj Mdck Quad Pf 11/03/2016   Influenza Split 11/29/2010, 12/05/2011   Influenza Whole 11/07/2009   Influenza, Seasonal, Injecte, Preservative Fre 11/24/2014   Influenza,inj,Quad PF,6+ Mos 12/07/2012, 01/07/2014, 11/30/2017, 10/24/2018, 12/02/2019, 11/03/2020   Influenza-Unspecified 11/24/2014, 10/09/2015, 11/03/2016   PFIZER Comirnaty(Gray Top)Covid-19 Tri-Sucrose Vaccine 10/31/2021   PFIZER(Purple Top)SARS-COV-2 Vaccination 04/23/2019, 05/18/2019, 12/02/2019, 11/03/2020   Td 12/13/2004   Tdap 01/19/2015   Zoster Recombinat (Shingrix) 01/12/2018, 03/16/2018    TDAP status: Up to date  Flu Vaccine status: Up to date  Pneumococcal vaccine status: Due, Education has been provided regarding the importance of this vaccine. Advised may receive this vaccine at local pharmacy or Health Dept. Aware to provide a copy of the vaccination record if obtained from local pharmacy or Health Dept. Verbalized acceptance and understanding.  Covid-19 vaccine status: Completed vaccines  Qualifies for Shingles Vaccine? Yes   Zostavax completed Yes   Shingrix Completed?: Yes  Screening Tests Health Maintenance  Topic Date  Due   Pneumonia Vaccine 31+ Years old (1 - PCV) Never done   COVID-19 Vaccine (6 - 2023-24 season) 12/26/2021   MAMMOGRAM  11/17/2022   Medicare Annual Wellness (AWV)  01/31/2023   COLONOSCOPY (Pts 45-66yr Insurance coverage will need to be  confirmed)  03/21/2024   DTaP/Tdap/Td (3 - Td or Tdap) 01/18/2025   INFLUENZA VACCINE  Completed   DEXA SCAN  Completed   Hepatitis C Screening  Completed   Zoster Vaccines- Shingrix  Completed   HPV VACCINES  Aged Out    Health Maintenance  Health Maintenance Due  Topic Date Due   Pneumonia Vaccine 66 Years old (1 - PCV) Never done   COVID-19 Vaccine (6 - 2023-24 season) 12/26/2021    Colorectal cancer screening: Type of screening: Colonoscopy. Completed 03/21/21. Repeat every 3 years  Mammogram status: Completed 11/16/21. Repeat every year  Bone Density status: Completed 09/26/16. Results reflect: Bone density results: OSTEOPENIA. Repeat every 2 years.   Additional Screening:  Hepatitis C Screening:  Completed 01/19/15  Vision Screening: Recommended annual ophthalmology exams for early detection of glaucoma and other disorders of the eye. Is the patient up to date with their annual eye exam?  Yes  Who is the provider or what is the name of the office in which the patient attends annual eye exams? Dr MEllie Lunch If pt is not established with a provider, would they like to be referred to a provider to establish care? No .   Dental Screening: Recommended annual dental exams for proper oral hygiene  Community Resource Referral / Chronic Care Management: CRR required this visit?  No   CCM required this visit?  No      Plan:     I have personally reviewed and noted the following in the patient's chart:   Medical and social history Use of alcohol, tobacco or illicit drugs  Current medications and supplements including opioid prescriptions. Patient is not currently taking opioid prescriptions. Functional ability and status Nutritional status Physical activity Advanced directives List of other physicians Hospitalizations, surgeries, and ER visits in previous 12 months Vitals Screenings to include cognitive, depression, and falls Referrals and appointments  In addition,  I have reviewed and discussed with patient certain preventive protocols, quality metrics, and best practice recommendations. A written personalized care plan for preventive services as well as general preventive health recommendations were provided to patient.     TWillette Brace Sarah Zuniga   [id number]  Nurse Notes: none   Review and Agree with assessment & plan of Sarah Zuniga   EMackie Pai PA-C

## 2022-01-30 NOTE — Patient Instructions (Signed)
Sarah Zuniga , Thank you for taking time to come for your Medicare Wellness Visit. I appreciate your ongoing commitment to your health goals. Please review the following plan we discussed and let me know if I can assist you in the future.   These are the goals we discussed:  Goals      Patient Stated     Stay fit and active         This is a list of the screening recommended for you and due dates:  Health Maintenance  Topic Date Due   Pneumonia Vaccine (1 - PCV) Never done   COVID-19 Vaccine (6 - 2023-24 season) 12/26/2021   Mammogram  11/17/2022   Medicare Annual Wellness Visit  01/31/2023   Colon Cancer Screening  03/21/2024   DTaP/Tdap/Td vaccine (3 - Td or Tdap) 01/18/2025   Flu Shot  Completed   DEXA scan (bone density measurement)  Completed   Hepatitis C Screening: USPSTF Recommendation to screen - Ages 31-79 yo.  Completed   Zoster (Shingles) Vaccine  Completed   HPV Vaccine  Aged Out    Advanced directives: Please bring a copy of your health care power of attorney and living will to the office at your convenience.  Conditions/risks identified: stay fit and active  Next appointment: Follow up in one year for your annual wellness visit    Preventive Care 65 Years and Older, Female Preventive care refers to lifestyle choices and visits with your health care provider that can promote health and wellness. What does preventive care include? A yearly physical exam. This is also called an annual well check. Dental exams once or twice a year. Routine eye exams. Ask your health care provider how often you should have your eyes checked. Personal lifestyle choices, including: Daily care of your teeth and gums. Regular physical activity. Eating a healthy diet. Avoiding tobacco and drug use. Limiting alcohol use. Practicing safe sex. Taking low-dose aspirin every day. Taking vitamin and mineral supplements as recommended by your health care provider. What happens during an  annual well check? The services and screenings done by your health care provider during your annual well check will depend on your age, overall health, lifestyle risk factors, and family history of disease. Counseling  Your health care provider may ask you questions about your: Alcohol use. Tobacco use. Drug use. Emotional well-being. Home and relationship well-being. Sexual activity. Eating habits. History of falls. Memory and ability to understand (cognition). Work and work Statistician. Reproductive health. Screening  You may have the following tests or measurements: Height, weight, and BMI. Blood pressure. Lipid and cholesterol levels. These may be checked every 5 years, or more frequently if you are over 36 years old. Skin check. Lung cancer screening. You may have this screening every year starting at age 45 if you have a 30-pack-year history of smoking and currently smoke or have quit within the past 15 years. Fecal occult blood test (FOBT) of the stool. You may have this test every year starting at age 73. Flexible sigmoidoscopy or colonoscopy. You may have a sigmoidoscopy every 5 years or a colonoscopy every 10 years starting at age 59. Hepatitis C blood test. Hepatitis B blood test. Sexually transmitted disease (STD) testing. Diabetes screening. This is done by checking your blood sugar (glucose) after you have not eaten for a while (fasting). You may have this done every 1-3 years. Bone density scan. This is done to screen for osteoporosis. You may have this done starting at  age 45. Mammogram. This may be done every 1-2 years. Talk to your health care provider about how often you should have regular mammograms. Talk with your health care provider about your test results, treatment options, and if necessary, the need for more tests. Vaccines  Your health care provider may recommend certain vaccines, such as: Influenza vaccine. This is recommended every year. Tetanus,  diphtheria, and acellular pertussis (Tdap, Td) vaccine. You may need a Td booster every 10 years. Zoster vaccine. You may need this after age 25. Pneumococcal 13-valent conjugate (PCV13) vaccine. One dose is recommended after age 8. Pneumococcal polysaccharide (PPSV23) vaccine. One dose is recommended after age 47. Talk to your health care provider about which screenings and vaccines you need and how often you need them. This information is not intended to replace advice given to you by your health care provider. Make sure you discuss any questions you have with your health care provider. Document Released: 02/17/2015 Document Revised: 10/11/2015 Document Reviewed: 11/22/2014 Elsevier Interactive Patient Education  2017 [employer] Prevention in the Home Falls can cause injuries. They can happen to people of all ages. There are many things you can do to make your home safe and to help prevent falls. What can I do on the outside of my home? Regularly fix the edges of walkways and driveways and fix any cracks. Remove anything that might make you trip as you walk through a door, such as a raised step or threshold. Trim any bushes or trees on the path to your home. Use bright outdoor lighting. Clear any walking paths of anything that might make someone trip, such as rocks or tools. Regularly check to see if handrails are loose or broken. Make sure that both sides of any steps have handrails. Any raised decks and porches should have guardrails on the edges. Have any leaves, snow, or ice cleared regularly. Use sand or salt on walking paths during winter. Clean up any spills in your garage right away. This includes oil or grease spills. What can I do in the bathroom? Use night lights. Install grab bars by the toilet and in the tub and shower. Do not use towel bars as grab bars. Use non-skid mats or decals in the tub or shower. If you need to sit down in the shower, use a plastic,  non-slip stool. Keep the floor dry. Clean up any water that spills on the floor as soon as it happens. Remove soap buildup in the tub or shower regularly. Attach bath mats securely with double-sided non-slip rug tape. Do not have throw rugs and other things on the floor that can make you trip. What can I do in the bedroom? Use night lights. Make sure that you have a light by your bed that is easy to reach. Do not use any sheets or blankets that are too big for your bed. They should not hang down onto the floor. Have a firm chair that has side arms. You can use this for support while you get dressed. Do not have throw rugs and other things on the floor that can make you trip. What can I do in the kitchen? Clean up any spills right away. Avoid walking on wet floors. Keep items that you use a lot in easy-to-reach places. If you need to reach something above you, use a strong step stool that has a grab bar. Keep electrical cords out of the way. Do not use floor polish or wax that makes floors  slippery. If you must use wax, use non-skid floor wax. Do not have throw rugs and other things on the floor that can make you trip. What can I do with my stairs? Do not leave any items on the stairs. Make sure that there are handrails on both sides of the stairs and use them. Fix handrails that are broken or loose. Make sure that handrails are as long as the stairways. Check any carpeting to make sure that it is firmly attached to the stairs. Fix any carpet that is loose or worn. Avoid having throw rugs at the top or bottom of the stairs. If you do have throw rugs, attach them to the floor with carpet tape. Make sure that you have a light switch at the top of the stairs and the bottom of the stairs. If you do not have them, ask someone to add them for you. What else can I do to help prevent falls? Wear shoes that: Do not have high heels. Have rubber bottoms. Are comfortable and fit you well. Are closed  at the toe. Do not wear sandals. If you use a stepladder: Make sure that it is fully opened. Do not climb a closed stepladder. Make sure that both sides of the stepladder are locked into place. Ask someone to hold it for you, if possible. Clearly mark and make sure that you can see: Any grab bars or handrails. First and last steps. Where the edge of each step is. Use tools that help you move around (mobility aids) if they are needed. These include: Canes. Walkers. Scooters. Crutches. Turn on the lights when you go into a dark area. Replace any light bulbs as soon as they burn out. Set up your furniture so you have a clear path. Avoid moving your furniture around. If any of your floors are uneven, fix them. If there are any pets around you, be aware of where they are. Review your medicines with your doctor. Some medicines can make you feel dizzy. This can increase your chance of falling. Ask your doctor what other things that you can do to help prevent falls. This information is not intended to replace advice given to you by your health care provider. Make sure you discuss any questions you have with your health care provider. Document Released: 11/17/2008 Document Revised: 06/29/2015 Document Reviewed: 02/25/2014 Elsevier Interactive Patient Education  2017 [employer].

## 2022-02-24 ENCOUNTER — Other Ambulatory Visit: Payer: Self-pay | Admitting: Family Medicine

## 2022-02-24 DIAGNOSIS — I10 Essential (primary) hypertension: Secondary | ICD-10-CM

## 2022-03-08 ENCOUNTER — Ambulatory Visit: Payer: Self-pay | Admitting: Surgical

## 2022-03-08 ENCOUNTER — Encounter: Payer: Self-pay | Admitting: Surgical

## 2022-03-08 VITALS — BP 133/87 | HR 69 | Ht 66.0 in | Wt 159.0 lb

## 2022-03-08 DIAGNOSIS — Z411 Encounter for cosmetic surgery: Secondary | ICD-10-CM

## 2022-03-08 NOTE — Progress Notes (Signed)
Botulinum Toxin Procedure Note  Procedure: Cosmetic botulinum toxin  Pre-operative Diagnosis: Dynamic rhytides  Post-operative Diagnosis: Same  Complications:  None  Brief history: The patient desires botulinum toxin injection.  She is aware of the risks including bleeding, damage to deeper structures, asymmetry, brow ptosis, eyelid ptosis, bruising. The patient understands and wishes to proceed.  Procedure: The area was prepped with alcohol and dried with a clean gauze.  Using a clean technique the botulinum toxin was diluted with 2.5 mL of bacteriostatic saline per 100 unit vial which resulted in 4 units per 0.1 mL.  Subsequently the mixture was injected in the glabellar. A total of 20 Units of botulinum toxin was used. The glabellar area was injected with care to inject intramuscular only while holding pressure on the supratrochlear vessels in each area during each injection on either side of the medial corrugators.  No complications were noted. Light pressure was held for 5 minutes. She was instructed explicitly in post-operative care.  Botox LOT:  [id number] EXP:  03/2024

## 2022-04-05 ENCOUNTER — Encounter: Payer: Self-pay | Admitting: Family Medicine

## 2022-04-05 ENCOUNTER — Ambulatory Visit (INDEPENDENT_AMBULATORY_CARE_PROVIDER_SITE_OTHER): Payer: Medicare Other | Admitting: Family Medicine

## 2022-04-05 VITALS — BP 118/76 | HR 57 | Temp 98.1°F | Resp 16 | Ht 66.0 in | Wt 160.0 lb

## 2022-04-05 DIAGNOSIS — I10 Essential (primary) hypertension: Secondary | ICD-10-CM | POA: Diagnosis not present

## 2022-04-05 DIAGNOSIS — E559 Vitamin D deficiency, unspecified: Secondary | ICD-10-CM

## 2022-04-05 DIAGNOSIS — Z0000 Encounter for general adult medical examination without abnormal findings: Secondary | ICD-10-CM | POA: Diagnosis not present

## 2022-04-05 DIAGNOSIS — E785 Hyperlipidemia, unspecified: Secondary | ICD-10-CM | POA: Diagnosis not present

## 2022-04-05 DIAGNOSIS — Z23 Encounter for immunization: Secondary | ICD-10-CM

## 2022-04-05 DIAGNOSIS — E042 Nontoxic multinodular goiter: Secondary | ICD-10-CM

## 2022-04-05 NOTE — Assessment & Plan Note (Signed)
Per endo °

## 2022-04-05 NOTE — Assessment & Plan Note (Signed)
Well controlled, no changes to meds. Encouraged heart healthy diet such as the DASH diet and exercise as tolerated.  °

## 2022-04-05 NOTE — Patient Instructions (Signed)
Preventive Care 65 Years and Older, Female Preventive care refers to lifestyle choices and visits with your health care provider that can promote health and wellness. Preventive care visits are also called wellness exams. What can I expect for my preventive care visit? Counseling Your health care provider may ask you questions about your: Medical history, including: Past medical problems. Family medical history. Pregnancy and menstrual history. History of falls. Current health, including: Memory and ability to understand (cognition). Emotional well-being. Home life and relationship well-being. Sexual activity and sexual health. Lifestyle, including: Alcohol, nicotine or tobacco, and drug use. Access to firearms. Diet, exercise, and sleep habits. Work and work environment. Sunscreen use. Safety issues such as seatbelt and bike helmet use. Physical exam Your health care provider will check your: Height and weight. These may be used to calculate your BMI (body mass index). BMI is a measurement that tells if you are at a healthy weight. Waist circumference. This measures the distance around your waistline. This measurement also tells if you are at a healthy weight and may help predict your risk of certain diseases, such as type 2 diabetes and high blood pressure. Heart rate and blood pressure. Body temperature. Skin for abnormal spots. What immunizations do I need?  Vaccines are usually given at various ages, according to a schedule. Your health care provider will recommend vaccines for you based on your age, medical history, and lifestyle or other factors, such as travel or where you work. What tests do I need? Screening Your health care provider may recommend screening tests for certain conditions. This may include: Lipid and cholesterol levels. Hepatitis C test. Hepatitis B test. HIV (human immunodeficiency virus) test. STI (sexually transmitted infection) testing, if you are at  risk. Lung cancer screening. Colorectal cancer screening. Diabetes screening. This is done by checking your blood sugar (glucose) after you have not eaten for a while (fasting). Mammogram. Talk with your health care provider about how often you should have regular mammograms. BRCA-related cancer screening. This may be done if you have a family history of breast, ovarian, tubal, or peritoneal cancers. Bone density scan. This is done to screen for osteoporosis. Talk with your health care provider about your test results, treatment options, and if necessary, the need for more tests. Follow these instructions at home: Eating and drinking  Eat a diet that includes fresh fruits and vegetables, whole grains, lean protein, and low-fat dairy products. Limit your intake of foods with high amounts of sugar, saturated fats, and salt. Take vitamin and mineral supplements as recommended by your health care provider. Do not drink alcohol if your health care provider tells you not to drink. If you drink alcohol: Limit how much you have to 0-1 drink a day. Know how much alcohol is in your drink. In the [place] one drink equals one 12 oz bottle of beer (355 mL), one 5 oz glass of wine (148 mL), or one 1 oz glass of hard liquor (44 mL). Lifestyle Brush your teeth every morning and night with fluoride toothpaste. Floss one time each day. Exercise for at least 30 minutes 5 or more days each week. Do not use any products that contain nicotine or tobacco. These products include cigarettes, chewing tobacco, and vaping devices, such as e-cigarettes. If you need help quitting, ask your health care provider. Do not use drugs. If you are sexually active, practice safe sex. Use a condom or other form of protection in order to prevent STIs. Take aspirin only as told by   your health care provider. Make sure that you understand how much to take and what form to take. Work with your health care provider to find out whether it  is safe and beneficial for you to take aspirin daily. Ask your health care provider if you need to take a cholesterol-lowering medicine (statin). Find healthy ways to manage stress, such as: Meditation, yoga, or listening to music. Journaling. Talking to a trusted person. Spending time with friends and family. Minimize exposure to UV radiation to reduce your risk of skin cancer. Safety Always wear your seat belt while driving or riding in a vehicle. Do not drive: If you have been drinking alcohol. Do not ride with someone who has been drinking. When you are tired or distracted. While texting. If you have been using any mind-altering substances or drugs. Wear a helmet and other protective equipment during sports activities. If you have firearms in your house, make sure you follow all gun safety procedures. What's next? Visit your health care provider once a year for an annual wellness visit. Ask your health care provider how often you should have your eyes and teeth checked. Stay up to date on all vaccines. This information is not intended to replace advice given to you by your health care provider. Make sure you discuss any questions you have with your health care provider. Document Revised: 07/19/2020 Document Reviewed: 07/19/2020 Elsevier Patient Education  2023 Elsevier Inc.   

## 2022-04-05 NOTE — Assessment & Plan Note (Signed)
Encourage heart healthy diet such as MIND or DASH diet, increase exercise, avoid trans fats, simple carbohydrates and processed foods, consider a krill or fish or flaxseed oil cap daily.  °

## 2022-04-05 NOTE — Progress Notes (Addendum)
Subjective:   By signing my name below, I, Marlana Latus, attest that this documentation has been prepared under the direction and in the presence of Ann Held, DO 04/05/22   Patient ID: Sarah Zuniga, female    DOB: 11/22/1955, 67 y.o.   MRN: [id number]  Chief Complaint  Patient presents with   Annual Exam    Pt states not fasting     HPI Patient is in today for a comprehensive physical exam.   She has been taking medications for her thyroid. She recently has been taking a new prebiotic she bought at [employer]. She reports it has been making her have regular bowel movements.   She reports that she had an abdominal CT scan that showed cysts on her kidney and liver, and a lesion on the pancreas.   She stays active by going to the gym with her sister. She does exercises for balance and weight training.   She has a full time job and a second part time job. She notes that she stays pretty busy. 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  Last colonoscopy: 03/21/2021. Polyps found and removed. Diverticulosis and hemorrhoids found. Otherwise normal.   Last mammogram: 11/16/2021. Results were normal.   Last pap: 11/09/2020. Results were normal  Last bone density: 09/26/2016. Osteopenic.   She has not had a pneumonia vaccine, but is interested in receiving it today.   She is is UTD on routine vision care. She recently had cataracts surgery. She had one eye done in 12/2021 and the other eye the first week of 01/2022.   She tends to enjoy a glass of wine every 2 weeks.   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 , left   Hyperlipidemia    Hypertension    Pigment dispersion syndrome of both eyes    Thyroid disease     Past Surgical History:  Procedure Laterality Date   CATARACT EXTRACTION Right    EYE SURGERY Left 12/08/2014   cataract surgery   TONSILLECTOMY     WISDOM TOOTH EXTRACTION      Family History   Problem Relation Age of Onset   Diabetes Mother    Hypertension Mother    Hyperlipidemia Mother    Stroke Mother    Kidney disease Mother    Colon polyps Father    Cancer Father        lung   Hyperlipidemia Father    Hypertension Father    Hyperlipidemia Sister    Hypertension Sister    Esophageal cancer Maternal Grandmother    Breast cancer Paternal Grandmother    Coronary artery disease Other        1st degree relative female <50.Marland KitchenMarland Kitchenfemale <60   Colon cancer Neg Hx    Rectal cancer Neg Hx    Stomach cancer Neg Hx     Social History   Socioeconomic History   Marital status: Divorced    Spouse name: Not on file   Number of children: 0   Years of education: college 3   Highest education level: Not on file  Occupational History   Occupation: [employer] account executive   Occupation: [employer] officer: [place]  Tobacco Use   Smoking status: Never   Smokeless tobacco: Never  Substance and Sexual Activity   Alcohol use: Yes    Comment: wine weekly  Drug use: No   Sexual activity: Not Currently    Partners: Male  Other Topics Concern   Not on file  Social History Narrative   Exercise-- 4x a week   Social Determinants of Health   Financial Resource Strain: Low Risk  (01/30/2022)   Overall Financial Resource Strain (CARDIA)    Difficulty of Paying Living Expenses: Not hard at all  Food Insecurity: No Food Insecurity (01/30/2022)   Hunger Vital Sign    Worried About Running Out of Food in the Last Year: Never true    Ran Out of Food in the Last Year: Never true  Transportation Needs: No Transportation Needs (01/30/2022)   PRAPARE - Hydrologist (Medical): No    Lack of Transportation (Non-Medical): No  Physical Activity: Sufficiently Active (01/30/2022)   Exercise Vital Sign    Days of Exercise per Week: 5 days    Minutes of Exercise per Session: 40 min  Stress: No Stress Concern Present (01/30/2022)   [place]    Feeling of Stress : Not at all  Social Connections: Moderately Integrated (01/30/2022)   Social Connection and Isolation Panel [NHANES]    Frequency of Communication with Friends and Family: More than three times a week    Frequency of Social Gatherings with Friends and Family: More than three times a week    Attends Religious Services: 1 to 4 times per year    Active Member of [employer] or Organizations: Yes    Attends Archivist Meetings: 1 to 4 times per year    Marital Status: Divorced  Human resources officer Violence: Not At Risk (01/30/2022)   Humiliation, Afraid, Rape, and Kick questionnaire    Fear of Current or Ex-Partner: No    Emotionally Abused: No    Physically Abused: No    Sexually Abused: No    Outpatient Medications Prior to Visit  Medication Sig Dispense Refill   cetirizine (ZYRTEC) 10 MG tablet Take 10 mg by mouth daily.       Cholecalciferol (VITAMIN D3) 2000 units TABS Take 1 tablet by mouth daily.     Cyanocobalamin (B-12) 5000 MCG SUBL Place 5,000 mcg under the tongue daily.     lisinopril (ZESTRIL) 20 MG tablet TAKE 1 TABLET BY MOUTH EVERY DAY 90 tablet 0   Magnesium 400 MG CAPS Take 1 capsule by mouth daily.     Multiple Vitamin (MULTIVITAMIN) tablet Take 1 tablet by mouth daily.     simvastatin (ZOCOR) 40 MG tablet TAKE 1 TABLET BY MOUTH EVERYDAY AT BEDTIME 90 tablet 1   COMIRNATY SUSP injection  (Patient not taking: Reported on 04/05/2022)     FLUAD QUADRIVALENT 0.5 ML injection  (Patient not taking: Reported on 04/05/2022)     No facility-administered medications prior to visit.    No Known Allergies  Review of Systems  Constitutional:  Negative for fever and malaise/fatigue.  HENT:  Negative for congestion.   Eyes:  Negative for blurred vision.  Respiratory:  Negative for cough and shortness of breath.   Cardiovascular:  Negative for chest pain, palpitations and leg swelling.   Gastrointestinal:  Negative for abdominal pain, blood in stool, nausea and vomiting.  Genitourinary:  Negative for dysuria and frequency.  Musculoskeletal:  Negative for back pain and falls.  Skin:  Negative for rash.  Neurological:  Negative for dizziness, loss of consciousness and headaches.  Endo/Heme/Allergies:  Negative for environmental allergies.  Psychiatric/Behavioral:  Negative for depression. The patient is not nervous/anxious.        Objective:    Physical Exam Vitals and nursing note reviewed.  Constitutional:      General: She is not in acute distress.    Appearance: Normal appearance. She is well-developed.  HENT:     Head: Normocephalic and atraumatic.     Right Ear: External ear normal.     Left Ear: External ear normal.  Eyes:     Extraocular Movements: Extraocular movements intact.     Conjunctiva/sclera: Conjunctivae normal.     Pupils: Pupils are equal, round, and reactive to light.  Neck:     Thyroid: No thyromegaly.     Vascular: No carotid bruit or JVD.  Cardiovascular:     Rate and Rhythm: Normal rate and regular rhythm.     Pulses: Normal pulses.     Heart sounds: Normal heart sounds. No murmur heard.    No gallop.  Pulmonary:     Effort: Pulmonary effort is normal. No respiratory distress.     Breath sounds: Normal breath sounds. No wheezing or rales.  Chest:     Chest wall: No tenderness.  Abdominal:     General: Abdomen is flat.     Palpations: Abdomen is soft.     Tenderness: There is no abdominal tenderness. There is no guarding or rebound.  Musculoskeletal:        General: No swelling, tenderness, deformity or signs of injury. Normal range of motion.     Cervical back: Normal range of motion and neck supple.     Right lower leg: No edema.     Left lower leg: No edema.  Skin:    General: Skin is warm and dry.     Findings: No bruising, erythema, lesion or rash.  Neurological:     General: No focal deficit present.     Mental Status:  She is alert and oriented to person, place, and time.     Motor: No weakness.  Psychiatric:        Judgment: Judgment normal.     BP 118/76 (BP Location: Left Arm, Patient Position: Sitting, Cuff Size: Normal)   Pulse (!) 57   Temp 98.1 F (36.7 C) (Oral)   Resp 16   Ht '5\' 6"'$  (1.676 m)   Wt 160 lb (72.6 kg)   SpO2 97%   BMI 25.82 kg/m  Wt Readings from Last 3 Encounters:  04/05/22 160 lb (72.6 kg)  03/08/22 159 lb (72.1 kg)  01/30/22 157 lb (71.2 kg)       Assessment & Plan:  Preventative health care Assessment & Plan: ghm utd Check labs  See AVS  There are no preventive care reminders to display for this patient.    Multiple thyroid nodules Assessment & Plan: Per endo  Orders: -     TSH -     CBC with Differential/Platelet  Primary hypertension -     CBC with Differential/Platelet  Hyperlipidemia, unspecified hyperlipidemia type Assessment & Plan: Encourage heart healthy diet such as MIND or DASH diet, increase exercise, avoid trans fats, simple carbohydrates and processed foods, consider a krill or fish or flaxseed oil cap daily.    Orders: -     Lipid panel -     Comprehensive metabolic panel  Vitamin D deficiency -     VITAMIN D 25 Hydroxy (Vit-D Deficiency, Fractures) -     CBC with Differential/Platelet  Need for pneumococcal 20-valent  conjugate vaccination -     Pneumococcal conjugate vaccine 20-valent  Essential hypertension Assessment & Plan: Well controlled, no changes to meds. Encouraged heart healthy diet such as the DASH diet and exercise as tolerated.        I,Rachel Rivera,acting as a Education administrator for [employer], DO.,have documented all relevant documentation on the behalf of Ann Held, DO,as directed by  Ann Held, DO while in the presence of [place], DO, personally preformed the services described in this documentation.  All medical record entries made by the  scribe were at my direction and in my presence.  I have reviewed the chart and discharge instructions (if applicable) and agree that the record reflects my personal performance and is accurate and complete. 04/05/22   Ann Held, DO

## 2022-04-05 NOTE — Assessment & Plan Note (Signed)
ghm utd Check labs  See AVS  There are no preventive care reminders to display for this patient.

## 2022-04-06 LAB — CBC WITH DIFFERENTIAL/PLATELET
Absolute Monocytes: 401 cells/uL (ref 200–950)
Basophils Absolute: 41 cells/uL (ref 0–200)
Basophils Relative: 0.7 %
Eosinophils Absolute: 89 cells/uL (ref 15–500)
Eosinophils Relative: 1.5 %
HCT: 44.4 % (ref 35.0–45.0)
Hemoglobin: 15.2 g/dL (ref 11.7–15.5)
Lymphs Abs: 2472 cells/uL (ref 850–3900)
MCH: 31.1 pg (ref 27.0–33.0)
MCHC: 34.2 g/dL (ref 32.0–36.0)
MCV: 90.8 fL (ref 80.0–100.0)
MPV: 11.6 fL (ref 7.5–12.5)
Monocytes Relative: 6.8 %
Neutro Abs: 2897 cells/uL (ref 1500–7800)
Neutrophils Relative %: 49.1 %
Platelets: 229 10*3/uL (ref 140–400)
RBC: 4.89 10*6/uL (ref 3.80–5.10)
RDW: 12.9 % (ref 11.0–15.0)
Total Lymphocyte: 41.9 %
WBC: 5.9 10*3/uL (ref 3.8–10.8)

## 2022-04-06 LAB — COMPREHENSIVE METABOLIC PANEL
AG Ratio: 1.8 (calc) (ref 1.0–2.5)
ALT: 19 U/L (ref 6–29)
AST: 22 U/L (ref 10–35)
Albumin: 4.9 g/dL (ref 3.6–5.1)
Alkaline phosphatase (APISO): 49 U/L (ref 37–153)
BUN: 15 mg/dL (ref 7–25)
CO2: 23 mmol/L (ref 20–32)
Calcium: 10.3 mg/dL (ref 8.6–10.4)
Chloride: 104 mmol/L (ref 98–110)
Creat: 0.78 mg/dL (ref 0.50–1.05)
Globulin: 2.8 g/dL (calc) (ref 1.9–3.7)
Glucose, Bld: 80 mg/dL (ref 65–99)
Potassium: 4.7 mmol/L (ref 3.5–5.3)
Sodium: 140 mmol/L (ref 135–146)
Total Bilirubin: 0.6 mg/dL (ref 0.2–1.2)
Total Protein: 7.7 g/dL (ref 6.1–8.1)

## 2022-04-06 LAB — TSH: TSH: 0.58 mIU/L (ref 0.40–4.50)

## 2022-04-06 LAB — LIPID PANEL
Cholesterol: 188 mg/dL (ref <200)
HDL: 57 mg/dL (ref >=50)
LDL Cholesterol (Calc): 110 mg/dL (calc) — ABNORMAL HIGH
Non-HDL Cholesterol (Calc): 131 mg/dL (calc) — ABNORMAL HIGH (ref <130)
Total CHOL/HDL Ratio: 3.3 (calc) (ref <5.0)
Triglycerides: 107 mg/dL (ref <150)

## 2022-04-06 LAB — VITAMIN D 25 HYDROXY (VIT D DEFICIENCY, FRACTURES): Vit D, 25-Hydroxy: 32 ng/mL (ref 30–100)

## 2022-04-29 DIAGNOSIS — D225 Melanocytic nevi of trunk: Secondary | ICD-10-CM | POA: Diagnosis not present

## 2022-04-29 DIAGNOSIS — L908 Other atrophic disorders of skin: Secondary | ICD-10-CM | POA: Diagnosis not present

## 2022-04-29 DIAGNOSIS — Z1283 Encounter for screening for malignant neoplasm of skin: Secondary | ICD-10-CM | POA: Diagnosis not present

## 2022-05-24 ENCOUNTER — Other Ambulatory Visit: Payer: Self-pay | Admitting: Family Medicine

## 2022-05-24 DIAGNOSIS — I10 Essential (primary) hypertension: Secondary | ICD-10-CM

## 2022-08-20 ENCOUNTER — Other Ambulatory Visit: Payer: Self-pay | Admitting: Family Medicine

## 2022-08-20 DIAGNOSIS — I10 Essential (primary) hypertension: Secondary | ICD-10-CM

## 2022-08-20 DIAGNOSIS — E785 Hyperlipidemia, unspecified: Secondary | ICD-10-CM

## 2022-08-27 ENCOUNTER — Ambulatory Visit: Payer: Medicare Other | Admitting: Internal Medicine

## 2022-08-27 ENCOUNTER — Encounter: Payer: Self-pay | Admitting: Internal Medicine

## 2022-08-27 VITALS — BP 118/82 | HR 76 | Ht 66.0 in | Wt 163.4 lb

## 2022-08-27 DIAGNOSIS — E042 Nontoxic multinodular goiter: Secondary | ICD-10-CM | POA: Diagnosis not present

## 2022-08-27 NOTE — Progress Notes (Signed)
Patient ID: Sarah Zuniga, female   DOB: [birth year], 67 y.o.   MRN: [id number]   HPI  Sarah Zuniga is a very pleasant 67 y.o.-year-old female, initially referred by her PCP, Dr. Zola Button, returning for  thyroid nodules. Last visit 2 years ago.  Interim history: At last visit she felt that the nodule was slightly more visible, possibly due to weight loss (she was in the [employer] program).  It is approximately stable since then. She continues to have problems swallowing nuts, lettuce. She also continues to have voice fatigue and hoarseness.  Reviewed history: She was diagnosed with thyroid nodule in 2016, and at that time she saw ENT (Dr. Annalee Genta).  He ordered a thyroid ultrasound and then biopsied the largest nodule.  Results were benign.  Thyroid U/S (01/31/2015): Entire gland heterogeneous, with increased blood flow; right dominant nodule, 3.5 cm, isoechoic, mixed, with some blood flow, with spongiform appearance per my review of the images.  She has another right mid thyroid nodule, 1.4 cm.  FNA of the right dominant nodule (04/06/2015): Benign  Thyroid U/S (06/05/2017): CLINICAL DATA:  Goiter. 67 year old female with a history of multiple thyroid nodules. Patient previously underwent biopsy of the largest right thyroid nodule on 04/06/2015  Parenchymal Echotexture: Markedly heterogenous Isthmus: 0.6 cm Right lobe: 8.0 x 3.0 x 3.5 cm Left lobe: 5.0 x 1.5 x 1.7 cm _______________________________________________________  The previously biopsied mass occupying the majority of the right inferior gland measures 4.2 x 3.5 x 2.4 cm, slightly enlarged compared to 3.5 x 2.9 x 2.3 cm previously.   There is a 1.6 cm anechoic simple cyst in the right mid gland.   Multiple additional hypoechoic cysts and nodules bilaterally, none of which meet criteria for further evaluation.  IMPRESSION: 1. Slight interval enlargement of the previously biopsied thyroid nodule occupying the  majority of the right lower lobe compared to 01/31/2015. Recommend correlation with prior biopsy results. 2. Multiple bilateral thyroid cysts and nodules which do not meet criteria for biopsy or further imaging evaluation.  The right thyroid nodule was large and increased from before >> I suggested a biopsy.  FNA of this nodule (07/15/2017): AUS/FLUS, however, Afirma molecular test: benign (ROM 4%)  Thyroid U/S (10/02/2020): Parenchymal Echotexture: Moderately heterogeneous Isthmus: 0.6 cm Right lobe: 8.7 x 3.1 x 4.0 cm Left lobe: 5.4 x 1.4 x 1.1 cm _________________________________________________________   Nodule 1: 1.0 x 0.5 x 1.0 cm spongiform nodule in the superior right thyroid lobe does not meet criteria for imaging surveillance or FNA. ________________________________________________________   Nodule 2: 1.3 x 0.9 x 1.1 cm mixed solid cystic nodule in the superior right thyroid lobe does not meet criteria for imaging surveillance or FNA.  _________________________________________________________   Nodule 3: 1.6 x 1.4 x 1.2 cm spongiform nodule in the mid right thyroid lobe does not meet criteria for imaging surveillance or FNA.  _________________________________________________________   Additional subcentimeter left thyroid nodules do not meet criteria for FNA or imaging surveillance.   IMPRESSION: 1. Previously biopsied right thyroid nodule is not well defined on the current examination. 2. Remaining thyroid nodules do not meet criteria for FNA or imaging surveillance.  She denies: - dysphagia - choking - SOB with lying down Occasional hoarseness when talking more, also occasional difficulty swallowing - chews food well.  I reviewed her TFTs: Lab Results  Component Value Date   TSH 0.58 04/05/2022   TSH 0.76 11/07/2020   TSH 0.71 11/01/2019   TSH 0.73 03/31/2018  TSH 0.75 02/27/2017   TSH 0.90 01/24/2016   TSH 0.60 01/19/2015   TSH 0.877 01/07/2014    TSH 0.80 04/19/2013   TSH 1.02 12/07/2012   FREET4 0.88 03/31/2018   FREET4 0.86 12/05/2011   Her TPO and ATA antibodies were not elevated.  She was taking selenium 100 mcg daily and also 1 teaspoon of Dulse - red algae (iodine) daily.  She stopped the supplement before last visit.  + FH of thyroid ds. - M, sisters. No FH of thyroid cancer. No h/o radiation tx to head or neck. No herbal supplements. On collagen supplement. No Biotin. No recent steroids use.   Pt also has a history of an autoimmune ds.  - increased reabsorption of the teeth.  ROS: + See HPI  I reviewed pt's medications, allergies, PMH, social hx, family hx, and changes were documented in the history of present illness. Otherwise, unchanged from my initial visit note.  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 , left   Hyperlipidemia    Hypertension    Pigment dispersion syndrome of both eyes    Thyroid disease    Past Surgical History:  Procedure Laterality Date   CATARACT EXTRACTION Right    EYE SURGERY Left 12/08/2014   cataract surgery   TONSILLECTOMY     WISDOM TOOTH EXTRACTION     Social History   Socioeconomic History   Marital status: Divorced    Spouse name: Not on file   Number of children: 0   Years of education: college 3   Highest education level: Not on file  Occupational History   Occupation: Production designer, theatre/television/film  Social Needs  Tobacco Use   Smoking status: Never Smoker   Smokeless tobacco: Never Used  Substance and Sexual Activity   Alcohol use: Yes    Comment: Wine, vodka-weekend   Drug use: No  Exercise: Cardio, weights, 4-5 times a week.  Current Outpatient Medications on File Prior to Visit  Medication Sig Dispense Refill   cetirizine (ZYRTEC) 10 MG tablet Take 10 mg by mouth daily.       Cholecalciferol (VITAMIN D3) 2000 units TABS Take 1 tablet by mouth daily.     Cyanocobalamin (B-12) 5000 MCG SUBL Place 5,000 mcg under the  tongue daily.     lisinopril (ZESTRIL) 20 MG tablet Take 1 tablet (20 mg total) by mouth daily. 90 tablet 2   Magnesium 400 MG CAPS Take 1 capsule by mouth daily.     Multiple Vitamin (MULTIVITAMIN) tablet Take 1 tablet by mouth daily.     simvastatin (ZOCOR) 40 MG tablet Take 1 tablet (40 mg total) by mouth at bedtime. 90 tablet 2   No current facility-administered medications on file prior to visit.   No Known Allergies Family History  Problem Relation Age of Onset   Diabetes Mother    Hypertension Mother    Hyperlipidemia Mother    Stroke Mother    Kidney disease Mother    Colon polyps Father    Cancer Father        lung   Hyperlipidemia Father    Hypertension Father    Hyperlipidemia Sister    Hypertension Sister    Esophageal cancer Maternal Grandmother    Breast cancer Paternal Grandmother    Coronary artery disease Other        1st degree relative female <50.Marland KitchenMarland Kitchenfemale <60   Colon cancer Neg Hx  Rectal cancer Neg Hx    Stomach cancer Neg Hx    PE: BP 118/82 (BP Location: Left Arm, Patient Position: Sitting, Cuff Size: Normal)   Pulse 76   Ht 5\' 6"  (1.676 m)   Wt 163 lb 6.4 oz (74.1 kg)   SpO2 98%   BMI 26.37 kg/m  Wt Readings from Last 3 Encounters:  08/27/22 163 lb 6.4 oz (74.1 kg)  04/05/22 160 lb (72.6 kg)  03/08/22 159 lb (72.1 kg)   Constitutional: overweight, in NAD Eyes:  EOMI, no exophthalmos ENT: no neck masses, + R>L thyromegaly, no cervical lymphadenopathy Cardiovascular: RRR, No MRG Respiratory: CTA B Musculoskeletal: no deformities Skin:no rashes Neurological: no tremor with outstretched hands  ASSESSMENT: 1. Multiple thyroid nodules  PLAN: 1. Multiple thyroid nodules -Patient with a history of thyroid nodules, of which the dominant nodule was biopsied in 2016 with benign results.  In 2019, she had another thyroid ultrasound which showed a slightly larger nodule and we proceeded with nodule biopsy, which was inconclusive, however, the  molecular marker test (Afirma) results were benign.  The nodule was not hypoechoic, did not have microcalcifications, was more wide than tall and with smooth margins.  He did have some blood flow inside but not much different compared to the rest of the thyroid, in which the blood flow was also increased. -After last visit, we checked another thyroid ultrasound (10/02/2020) and this showed that the previous nodule was not well-defined anymore.  The rest of the nodule were not worrisome and did not meet criteria for follow-up. -Patient's thyroid antibodies were not elevated to point towards Hashimoto's thyroiditis. -Her thyroid function tests were normal.  Latest TSH available for review is from 04/05/2022. -At today's visit, patient continues to have some problems swallowing dry foods or lettuce.  Also, she has voice fatigue, but these are not new.  At last visit, she felt that the nodule was slightly more visible, but this was possibly due to weight loss.  She continues to notice the nodule whenever she looks in the mirror.  She also tells me that other people have commented on its appearance. -She still has a right thyroid prominence on palpation, however, she also has a larger adipose pannicule which may contribute to the right lower leg asymmetry. -We discussed at today's visit that thyroid lobectomy may raise her risk of hypothyroidism-approximately 20 to 25% of patients that had lobectomy may develop hypothyroidism.  Her mother used to take levothyroxine so she is aware of hypothyroidism management.  She would like to avoid thyroid surgery if possible. -For now, we decided to check another ultrasound and if the thyroid nodule is not visible anymore, no follow-up is needed  Orders Placed This Encounter  Procedures   US THYROID   Carlus Pavlov, MD PhD [facility]

## 2022-08-27 NOTE — Patient Instructions (Signed)
We will check another thyroid U/S.  Please return to see me as needed.

## 2022-09-04 ENCOUNTER — Ambulatory Visit
Admission: RE | Admit: 2022-09-04 | Discharge: 2022-09-04 | Disposition: A | Discharge status: Home / Self Care | Payer: Medicare Other | Source: Ambulatory Visit | Attending: Internal Medicine | Admitting: Internal Medicine

## 2022-09-04 DIAGNOSIS — E042 Nontoxic multinodular goiter: Secondary | ICD-10-CM | POA: Diagnosis not present

## 2022-10-31 ENCOUNTER — Ambulatory Visit: Payer: Medicare Other | Admitting: Internal Medicine

## 2022-11-05 ENCOUNTER — Other Ambulatory Visit: Payer: Self-pay | Admitting: Family Medicine

## 2022-11-05 DIAGNOSIS — Z1231 Encounter for screening mammogram for malignant neoplasm of breast: Secondary | ICD-10-CM

## 2022-11-21 ENCOUNTER — Ambulatory Visit
Admission: RE | Admit: 2022-11-21 | Discharge: 2022-11-21 | Disposition: A | Discharge status: Home / Self Care | Payer: Medicare Other | Source: Ambulatory Visit

## 2022-11-21 DIAGNOSIS — Z1231 Encounter for screening mammogram for malignant neoplasm of breast: Secondary | ICD-10-CM | POA: Diagnosis not present

## 2022-11-27 ENCOUNTER — Ambulatory Visit (INDEPENDENT_AMBULATORY_CARE_PROVIDER_SITE_OTHER): Payer: Self-pay | Admitting: Surgical

## 2022-11-27 DIAGNOSIS — Z411 Encounter for cosmetic surgery: Secondary | ICD-10-CM

## 2022-11-27 NOTE — Progress Notes (Signed)
Botulinum Toxin Procedure Note  Procedure: Cosmetic botulinum toxin  Pre-operative Diagnosis: Dynamic rhytides  Post-operative Diagnosis: Same  Complications:  None  Brief history: The patient desires botulinum toxin injection.  She is aware of the risks including bleeding, damage to deeper structures, asymmetry, brow ptosis, eyelid ptosis, bruising. The patient understands and wishes to proceed.  Procedure: The area was prepped with alcohol and dried with a clean gauze.  Using a clean technique the botulinum toxin was diluted with 2.5 mL of bacteriostatic saline per 100 unit vial which resulted in 4 units per 0.1 mL.  Subsequently the mixture was injected in the glabellar, lateral canthal lines, forehead area with preservation of the temporal branch to the lateral eyebrow. A total of 20 Units of botulinum toxin was used. The glabellar area was injected with care to inject intramuscular only while holding pressure on the supratrochlear vessels in each area during each injection on either side of the medial corrugators.   No complications were noted. Light pressure was held for 5 minutes. She was instructed explicitly in post-operative care.  Botox LOT:  [id number] EXP:  2026/09

## 2023-01-23 ENCOUNTER — Telehealth: Payer: Self-pay | Admitting: Family Medicine

## 2023-01-23 NOTE — Telephone Encounter (Signed)
Copied from CRM [id number]. Topic: Medicare AWV >> Jan 23, 2023 11:34 AM Payton Doughty wrote: Reason for CRM: Called LVM 01/23/2023 to schedule AWV. Please schedule office or virtual visits  Verlee Rossetti; Care Guide Ambulatory Clinical Support [place] [facility] Direct Dial: [phone]

## 2023-02-11 ENCOUNTER — Ambulatory Visit: Payer: Medicare Other

## 2023-02-11 VITALS — Ht 66.0 in | Wt 158.0 lb

## 2023-02-11 DIAGNOSIS — H5213 Myopia, bilateral: Secondary | ICD-10-CM | POA: Diagnosis not present

## 2023-02-11 DIAGNOSIS — Z0000 Encounter for general adult medical examination without abnormal findings: Secondary | ICD-10-CM

## 2023-02-11 DIAGNOSIS — H40013 Open angle with borderline findings, low risk, bilateral: Secondary | ICD-10-CM | POA: Diagnosis not present

## 2023-02-11 DIAGNOSIS — Z961 Presence of intraocular lens: Secondary | ICD-10-CM | POA: Diagnosis not present

## 2023-02-11 DIAGNOSIS — H21233 Degeneration of iris (pigmentary), bilateral: Secondary | ICD-10-CM | POA: Diagnosis not present

## 2023-02-11 NOTE — Patient Instructions (Addendum)
 Sarah Zuniga , Thank you for taking time to come for your Medicare Wellness Visit. I appreciate your ongoing commitment to your health goals. Please review the following plan we discussed and let me know if I can assist you in the future.   Referrals/Orders/Follow-Ups/Clinician Recommendations:   This is a list of the screening recommended for you and due dates:  Health Maintenance  Topic Date Due   Mammogram  11/21/2023   Medicare Annual Wellness Visit  02/11/2024   Colon Cancer Screening  03/21/2024   DTaP/Tdap/Td vaccine (3 - Td or Tdap) 01/18/2025   Pneumonia Vaccine  Completed   Flu Shot  Completed   DEXA scan (bone density measurement)  Completed   COVID-19 Vaccine  Completed   Hepatitis C Screening  Completed   Zoster (Shingles) Vaccine  Completed   HPV Vaccine  Aged Out    Advanced directives: (Copy Requested) Please bring a copy of your health care power of attorney and living will to the office to be added to your chart at your convenience.  Next Medicare Annual Wellness Visit scheduled for next year: Yes

## 2023-02-11 NOTE — Progress Notes (Signed)
 Subjective:   Sarah Zuniga is a 68 y.o. female who presents for Medicare Annual (Subsequent) preventive examination.  Visit Complete: Virtual I connected with  Sarah Zuniga on 02/11/23 by a audio enabled telemedicine application and verified that I am speaking with the correct person using two identifiers.  Patient Location: Home  Provider Location: Home Office  I discussed the limitations of evaluation and management by telemedicine. The patient expressed understanding and agreed to proceed.  Vital Signs: Because this visit was a virtual/telehealth visit, some criteria may be missing or patient reported. Any vitals not documented were not able to be obtained and vitals that have been documented are patient reported.  Patient Medicare AWV questionnaire was completed by the patient on 02/04/23; I have confirmed that all information answered by patient is correct and no changes since this date.  Cardiac Risk Factors include: advanced age (>82men, >24 women);hypertension     Objective:    Today's Vitals   02/11/23 0853  Weight: 158 lb (71.7 kg)  Height: 5' 6 (1.676 m)   Body mass index is 25.5 kg/m.     02/11/2023    9:01 AM 01/30/2022    9:05 AM  Advanced Directives  Does Patient Have a Medical Advance Directive? Yes No  Type of Estate Agent of [place];Living will   Copy of Healthcare Power of Attorney in Chart? No - copy requested   Would patient like information on creating a medical advance directive?  No - Patient declined    Current Medications (verified) Outpatient Encounter Medications as of 02/11/2023  Medication Sig   cetirizine (ZYRTEC) 10 MG tablet Take 10 mg by mouth daily.     Cholecalciferol (VITAMIN D3) 2000 units TABS Take 1 tablet by mouth daily.   Cyanocobalamin (B-12) 5000 MCG SUBL Place 5,000 mcg under the tongue daily.   lisinopril  (ZESTRIL ) 20 MG tablet Take 1 tablet (20 mg total) by mouth daily.   Magnesium 400 MG CAPS  Take 1 capsule by mouth daily.   Multiple Vitamin (MULTIVITAMIN) tablet Take 1 tablet by mouth daily.   simvastatin  (ZOCOR ) 40 MG tablet Take 1 tablet (40 mg total) by mouth at bedtime.   No facility-administered encounter medications on file as of 02/11/2023.    Allergies (verified) Patient has no known allergies.   History: 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 , left   Hyperlipidemia    Hypertension    Pigment dispersion syndrome of both eyes    Thyroid  disease    Past Surgical History:  Procedure Laterality Date   CATARACT EXTRACTION Right    EYE SURGERY Left 12/08/2014   cataract surgery   TONSILLECTOMY     WISDOM TOOTH EXTRACTION     Family History  Problem Relation Age of Onset   Diabetes Mother    Hypertension Mother    Hyperlipidemia Mother    Stroke Mother    Kidney disease Mother    Colon polyps Father    Cancer Father        lung   Hyperlipidemia Father    Hypertension Father    Hyperlipidemia Sister    Hypertension Sister    Esophageal cancer Maternal Grandmother    Breast cancer Paternal Grandmother    Coronary artery disease Other        1st degree relative female <50.SABRASABRAfemale <60   Colon cancer Neg Hx    Rectal cancer  Neg Hx    Stomach cancer Neg Hx    Social History   Socioeconomic History   Marital status: Divorced    Spouse name: Not on file   Number of children: 0   Years of education: college 3   Highest education level: Not on file  Occupational History   Occupation: scientific laboratory technician   Occupation: [employer] Manager: [employer]  Tobacco Use   Smoking status: Never   Smokeless tobacco: Never  Substance and Sexual Activity   Alcohol use: Yes    Comment: wine weekly   Drug use: No   Sexual activity: Not Currently    Partners: Male  Other Topics Concern   Not on file  Social History Narrative   Exercise-- 4x a week   Social Drivers of Research Scientist (physical Sciences) Strain: Low Risk  (02/11/2023)   Overall Financial Resource Strain (CARDIA)    Difficulty of Paying Living Expenses: Not hard at all  Food Insecurity: No Food Insecurity (02/11/2023)   Hunger Vital Sign    Worried About Running Out of Food in the Last Year: Never true    Ran Out of Food in the Last Year: Never true  Transportation Needs: No Transportation Needs (02/11/2023)   PRAPARE - Administrator, Civil Service (Medical): No    Lack of Transportation (Non-Medical): No  Physical Activity: Sufficiently Active (02/11/2023)   Exercise Vital Sign    Days of Exercise per Week: 4 days    Minutes of Exercise per Session: 40 min  Stress: No Stress Concern Present (02/11/2023)   [employer] of Occupational Health - Occupational Stress Questionnaire    Feeling of Stress : Not at all  Social Connections: Moderately Integrated (02/11/2023)   Social Connection and Isolation Panel [NHANES]    Frequency of Communication with Friends and Family: More than three times a week    Frequency of Social Gatherings with Friends and Family: More than three times a week    Attends Religious Services: More than 4 times per year    Active Member of [employer] or Organizations: Yes    Attends Engineer, Structural: More than 4 times per year    Marital Status: Divorced    Tobacco Counseling Counseling given: Not Answered   Clinical Intake:  Pre-visit preparation completed: Yes  Pain : No/denies pain     BMI - recorded: 25.5 Nutritional Status: BMI 25 -29 Overweight Nutritional Risks: None Diabetes: No  How often do you need to have someone help you when you read instructions, pamphlets, or other written materials from your doctor or pharmacy?: 1 - Never  Interpreter Needed?: No  Information entered by :: Rojelio Blush LPN   Activities of Daily Living    02/11/2023    9:00 AM 02/04/2023    8:33 AM  In your present state of health, do you have any difficulty performing  the following activities:  Hearing? 0 0  Vision? 0 0  Difficulty concentrating or making decisions? 0 0  Walking or climbing stairs? 0 0  Dressing or bathing? 0 0  Doing errands, shopping? 0 0  Preparing Food and eating ? N N  Using the Toilet? N N  In the past six months, have you accidently leaked urine? N N  Do you have problems with loss of bowel control? N N  Managing your Medications? N N  Managing your Finances? N N  Housekeeping or managing your  Housekeeping? N N    Patient Care Team: Antonio Meth, Jamee SAUNDERS, DO as PCP - General Leslee Reusing, MD as Consulting Physician (Ophthalmology) Jakie Alm SAUNDERS, MD as Consulting Physician (Gastroenterology)  Indicate any recent Medical Services you may have received from other than [facility] providers in the past year (date may be approximate).     Assessment:   This is a routine wellness examination for Sarah Zuniga.  Hearing/Vision screen Hearing Screening - Comments:: Denies hearing difficulties   Vision Screening - Comments:: Wears rx glasses - up to date with routine eye exams with  [facility]   Goals Addressed             This Visit's Progress    Increase physical activity       Stay Active       Depression Screen    02/11/2023    8:59 AM 01/30/2022    9:02 AM 11/07/2020   10:27 AM 11/01/2019    2:54 PM 02/27/2017    2:48 PM 01/30/2016    1:49 PM 09/19/2014    5:58 PM  PHQ 2/9 Scores  PHQ - 2 Score 0 0 0 0 0 0 0    Fall Risk    02/11/2023    9:00 AM 02/04/2023    8:33 AM 04/05/2022    3:01 PM 01/30/2022    9:06 AM 01/24/2022   10:53 AM  Fall Risk   Falls in the past year? 0 0 0 0 0  Number falls in past yr: 0 0 0 0 0  Injury with Fall? 0 0 0 0 0  Risk for fall due to : No Fall Risks   Impaired vision   Follow up Falls prevention discussed  Falls evaluation completed Falls prevention discussed     MEDICARE RISK AT HOME: Medicare Risk at Home Any stairs in or around the home?: (Patient-Rptd) Yes If  so, are there any without handrails?: No Home free of loose throw rugs in walkways, pet beds, electrical cords, etc?: (Patient-Rptd) Yes Adequate lighting in your home to reduce risk of falls?: (Patient-Rptd) Yes Life alert?: (Patient-Rptd) No Use of a cane, walker or w/c?: (Patient-Rptd) No Grab bars in the bathroom?: (Patient-Rptd) Yes Shower chair or bench in shower?: (Patient-Rptd) Yes Elevated toilet seat or a handicapped toilet?: (Patient-Rptd) No  TIMED UP AND GO:  Was the test performed?  No    Cognitive Function:        02/11/2023    9:01 AM 01/30/2022    9:07 AM  6CIT Screen  What Year? 0 points 0 points  What month? 0 points 0 points  What time? 0 points 0 points  Count back from 20 0 points 0 points  Months in reverse 0 points 2 points  Repeat phrase 0 points 0 points  Total Score 0 points 2 points    Immunizations Immunization History  Administered Date(s) Administered   Fluad Quad(high Dose 65+) 10/21/2021   Influenza Inj Mdck Quad Pf 11/03/2016   Influenza Split 11/29/2010, 12/05/2011   Influenza Whole 11/07/2009   Influenza, Seasonal, Injecte, Preservative Fre 11/24/2014   Influenza,inj,Quad PF,6+ Mos 12/07/2012, 01/07/2014, 11/30/2017, 10/24/2018, 12/02/2019, 11/03/2020   Influenza-Unspecified 11/24/2014, 10/09/2015, 11/03/2016   PFIZER Comirnaty(Gray Top)Covid-19 Tri-Sucrose Vaccine 10/31/2021   PFIZER(Purple Top)SARS-COV-2 Vaccination 04/23/2019, 05/18/2019, 12/02/2019, 11/03/2020   PNEUMOCOCCAL CONJUGATE-20 04/05/2022   Pfizer(Comirnaty)Fall Seasonal Vaccine 12 years and older 10/16/2022   Td 12/13/2004   Tdap 01/19/2015   Zoster Recombinant(Shingrix) 01/12/2018, 03/16/2018    TDAP status:  Up to date  Flu Vaccine status: Up to date  Pneumococcal vaccine status: Up to date  Covid-19 vaccine status: Completed vaccines  Qualifies for Shingles Vaccine? Yes   Zostavax completed Yes   Shingrix Completed?: Yes  Screening Tests Health  Maintenance  Topic Date Due   MAMMOGRAM  11/21/2023   Medicare Annual Wellness (AWV)  02/11/2024   Colonoscopy  03/21/2024   DTaP/Tdap/Td (3 - Td or Tdap) 01/18/2025   Pneumonia Vaccine 11+ Years old  Completed   INFLUENZA VACCINE  Completed   DEXA SCAN  Completed   COVID-19 Vaccine  Completed   Hepatitis C Screening  Completed   Zoster Vaccines- Shingrix  Completed   HPV VACCINES  Aged Out    Health Maintenance  There are no preventive care reminders to display for this patient.   Colorectal cancer screening: Type of screening: Colonoscopy. Completed 03/21/21. Repeat every 3 years  Mammogram status: Completed 11/21/22. Repeat every year  Bone Density status: Completed 10/27/16. Results reflect: Bone density results: OSTEOPENIA. Repeat every   years.     Additional Screening:  Hepatitis C Screening: does qualify; Completed 01/19/15  Vision Screening: Recommended annual ophthalmology exams for early detection of glaucoma and other disorders of the eye. Is the patient up to date with their annual eye exam?  Yes  Who is the provider or what is the name of the office in which the patient attends annual eye exams? [facility] If pt is not established with a provider, would they like to be referred to a provider to establish care? No .   Dental Screening: Recommended annual dental exams for proper oral hygiene    Community Resource Referral / Chronic Care Management:  CRR required this visit?  No   CCM required this visit?  No     Plan:     I have personally reviewed and noted the following in the patient's chart:   Medical and social history Use of alcohol, tobacco or illicit drugs  Current medications and supplements including opioid prescriptions. Patient is not currently taking opioid prescriptions. Functional ability and status Nutritional status Physical activity Advanced directives List of other physicians Hospitalizations, surgeries, and ER visits in  previous 12 months Vitals Screenings to include cognitive, depression, and falls Referrals and appointments  In addition, I have reviewed and discussed with patient certain preventive protocols, quality metrics, and best practice recommendations. A written personalized care plan for preventive services as well as general preventive health recommendations were provided to patient.     Rojelio LELON Blush, LPN   09/05/7972   After Visit Summary: (MyChart) Due to this being a telephonic visit, the after visit summary with patients personalized plan was offered to patient via MyChart   Nurse Notes: None

## 2023-05-11 ENCOUNTER — Other Ambulatory Visit: Payer: Self-pay | Admitting: Family Medicine

## 2023-05-11 DIAGNOSIS — I10 Essential (primary) hypertension: Secondary | ICD-10-CM

## 2023-05-19 ENCOUNTER — Ambulatory Visit (INDEPENDENT_AMBULATORY_CARE_PROVIDER_SITE_OTHER): Admitting: Family Medicine

## 2023-05-19 ENCOUNTER — Encounter: Payer: Self-pay | Admitting: Family Medicine

## 2023-05-19 VITALS — BP 140/90 | HR 66 | Temp 98.6°F | Resp 16 | Ht 66.0 in | Wt 162.8 lb

## 2023-05-19 DIAGNOSIS — E785 Hyperlipidemia, unspecified: Secondary | ICD-10-CM | POA: Diagnosis not present

## 2023-05-19 DIAGNOSIS — E559 Vitamin D deficiency, unspecified: Secondary | ICD-10-CM | POA: Diagnosis not present

## 2023-05-19 DIAGNOSIS — K862 Cyst of pancreas: Secondary | ICD-10-CM

## 2023-05-19 DIAGNOSIS — I10 Essential (primary) hypertension: Secondary | ICD-10-CM

## 2023-05-19 DIAGNOSIS — E049 Nontoxic goiter, unspecified: Secondary | ICD-10-CM

## 2023-05-19 LAB — COMPREHENSIVE METABOLIC PANEL WITH GFR
ALT: 20 U/L (ref 0–35)
AST: 26 U/L (ref 0–37)
Albumin: 5.1 g/dL (ref 3.5–5.2)
Alkaline Phosphatase: 52 U/L (ref 39–117)
BUN: 19 mg/dL (ref 6–23)
CO2: 24 meq/L (ref 19–32)
Calcium: 10 mg/dL (ref 8.4–10.5)
Chloride: 103 meq/L (ref 96–112)
Creatinine, Ser: 0.85 mg/dL (ref 0.40–1.20)
GFR: 70.95 mL/min (ref >60.00)
Glucose, Bld: 92 mg/dL (ref 70–99)
Potassium: 3.9 meq/L (ref 3.5–5.1)
Sodium: 138 meq/L (ref 135–145)
Total Bilirubin: 1 mg/dL (ref 0.2–1.2)
Total Protein: 7.5 g/dL (ref 6.0–8.3)

## 2023-05-19 LAB — CBC WITH DIFFERENTIAL/PLATELET
Basophils Absolute: 0 10*3/uL (ref 0.0–0.1)
Basophils Relative: 0.5 % (ref 0.0–3.0)
Eosinophils Absolute: 0.1 10*3/uL (ref 0.0–0.7)
Eosinophils Relative: 1.7 % (ref 0.0–5.0)
HCT: 44.3 % (ref 36.0–46.0)
Hemoglobin: 15 g/dL (ref 12.0–15.0)
Lymphocytes Relative: 33.8 % (ref 12.0–46.0)
Lymphs Abs: 1.6 10*3/uL (ref 0.7–4.0)
MCHC: 34 g/dL (ref 30.0–36.0)
MCV: 93.6 fl (ref 78.0–100.0)
Monocytes Absolute: 0.4 10*3/uL (ref 0.1–1.0)
Monocytes Relative: 7.8 % (ref 3.0–12.0)
Neutro Abs: 2.6 10*3/uL (ref 1.4–7.7)
Neutrophils Relative %: 56.2 % (ref 43.0–77.0)
Platelets: 198 10*3/uL (ref 150.0–400.0)
RBC: 4.73 Mil/uL (ref 3.87–5.11)
RDW: 13.2 % (ref 11.5–15.5)
WBC: 4.7 10*3/uL (ref 4.0–10.5)

## 2023-05-19 LAB — LIPID PANEL
Cholesterol: 176 mg/dL (ref 0–200)
HDL: 55.9 mg/dL (ref >39.00)
LDL Cholesterol: 94 mg/dL (ref 0–99)
NonHDL: 120.56
Total CHOL/HDL Ratio: 3
Triglycerides: 132 mg/dL (ref 0.0–149.0)
VLDL: 26.4 mg/dL (ref 0.0–40.0)

## 2023-05-19 LAB — TSH: TSH: 0.85 u[IU]/mL (ref 0.35–5.50)

## 2023-05-19 NOTE — Patient Instructions (Signed)

## 2023-05-19 NOTE — Assessment & Plan Note (Signed)
 Well controlled, no changes to meds. Encouraged heart healthy diet such as the DASH diet and exercise as tolerated.

## 2023-05-19 NOTE — Progress Notes (Signed)
 Established Patient Office Visit  Subjective   Patient ID: Sarah Zuniga, female    DOB: Mar 05, 1955  Age: 68 y.o. MRN: [id number]  Chief Complaint  Patient presents with   Hypertension   Hyperlipidemia   Follow-up    HPI Discussed the use of AI scribe software for clinical note transcription with the patient, who gave verbal consent to proceed.  History of Present Illness Sarah Zuniga is a 68 year old female who presents with concerns about elevated blood pressure and thyroid issues.  She is concerned about elevated blood pressure, noting that during a recent dental visit in March, her blood pressure was 120 over her usual baseline. She was informed that her blood pressure is currently high. She had the flu at the end of February, which resulted in chest congestion and a cough with clear sputum. She suspects the congestion might also be related to pollen. She is currently taking Zocor and CoQ10, having started CoQ10 two nights ago. No high salt diet, mentioning boiled shrimp, salad, and black beans consumed the previous day. She reports poor sleep with multiple awakenings, attributing this to anxiety about the appointment.  She expresses concerns about her thyroid, experiencing food getting stuck in her throat and hoarseness. A previous ultrasound indicated no need for a biopsy at that time. She is aware of a reduction in the size of her thyroid and has been consuming [place] nuts daily, which she believes may have contributed to this change.  She is also concerned about hair thinning, which she attributes to her thyroid condition and age. She has tried various serums without success and is considering oral minoxidil, as her sister has started using it with positive results. She has not found topical treatments satisfactory due to their greasy effect on her hair.  She mentions a history of a pancreatic lesion and cysts on her kidney and liver, discovered two years ago. She was advised  to follow up after two years, which was due this past February. She is aware of a family history of pancreatic cancer, as a coworker died from it at a young age.   Patient Active Problem List   Diagnosis Date Noted   Pelvic pain 04/13/2021   Periumbilical abdominal pain 04/13/2021   Hoarseness 11/07/2020   Multiple thyroid nodules 05/27/2017   Diplopia 04/28/2013   Herpes zoster 02/29/2008   Hyperlipidemia 05/07/2006   Essential hypertension 05/07/2006   Personal history presenting hazards to health 05/07/2006   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 , left   Hyperlipidemia    Hypertension    Pigment dispersion syndrome of both eyes    Thyroid disease    Past Surgical History:  Procedure Laterality Date   CATARACT EXTRACTION Right    EYE SURGERY Left 12/08/2014   cataract surgery   TONSILLECTOMY     WISDOM TOOTH EXTRACTION     Social History   Tobacco Use   Smoking status: Never   Smokeless tobacco: Never  Substance Use Topics   Alcohol use: Yes    Comment: wine weekly   Drug use: No   Social History   Socioeconomic History   Marital status: Divorced    Spouse name: Not on file   Number of children: 0   Years of education: college 3   Highest education level: Some college, no degree  Occupational History   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: [employer]  Tobacco Use   Smoking status: Never   Smokeless tobacco: Never  Substance and Sexual Activity   Alcohol use: Yes    Comment: wine weekly   Drug use: No   Sexual activity: Not Currently    Partners: Male  Other Topics Concern   Not on file  Social History Narrative   Exercise-- 4x a week   Social Drivers of Health   Financial Resource Strain: Low Risk  (05/16/2023)   Overall Financial Resource Strain (CARDIA)    Difficulty of Paying Living Expenses: Not hard at all  Food Insecurity: No Food  Insecurity (05/16/2023)   Hunger Vital Sign    Worried About Running Out of Food in the Last Year: Never true    Ran Out of Food in the Last Year: Never true  Transportation Needs: No Transportation Needs (05/16/2023)   PRAPARE - Administrator, Civil Service (Medical): No    Lack of Transportation (Non-Medical): No  Physical Activity: Insufficiently Active (05/16/2023)   Exercise Vital Sign    Days of Exercise per Week: 4 days    Minutes of Exercise per Session: 20 min  Stress: No Stress Concern Present (05/16/2023)   [employer] of Occupational Health - Occupational Stress Questionnaire    Feeling of Stress : Not at all  Social Connections: Moderately Integrated (05/16/2023)   Social Connection and Isolation Panel [NHANES]    Frequency of Communication with Friends and Family: More than three times a week    Frequency of Social Gatherings with Friends and Family: More than three times a week    Attends Religious Services: 1 to 4 times per year    Active Member of [employer] or Organizations: Patient declined    Attends Engineer, structural: More than 4 times per year    Marital Status: Divorced  Catering manager Violence: Not At Risk (02/11/2023)   Humiliation, Afraid, Rape, and Kick questionnaire    Fear of Current or Ex-Partner: No    Emotionally Abused: No    Physically Abused: No    Sexually Abused: No   Family Status  Relation Name Status   Mother  Deceased at age 14   Father  Deceased at age 15   Sister  Alive   Sister  Alive   MGM  Deceased   MGF  Deceased   PGM  Deceased   PGF  Alive   Other  (Not Specified)   Neg Hx  (Not Specified)  No partnership data on file   Family History  Problem Relation Age of Onset   Diabetes Mother    Hypertension Mother    Hyperlipidemia Mother    Stroke Mother    Kidney disease Mother    Colon polyps Father    Cancer Father        lung   Hyperlipidemia Father    Hypertension Father    Hyperlipidemia Sister     Hypertension Sister    Esophageal cancer Maternal Grandmother    Breast cancer Paternal Grandmother    Coronary artery disease Other        1st degree relative female <50.Aaron AasAaron Aasfemale <60   Colon cancer Neg Hx    Rectal cancer Neg Hx    Stomach cancer Neg Hx    No Known Allergies    Review of Systems  Constitutional:  Negative for fever and malaise/fatigue.  HENT:  Negative for congestion.   Eyes:  Negative for blurred vision.  Respiratory:  Negative for cough and shortness of breath.   Cardiovascular:  Negative for chest pain, palpitations and leg swelling.  Gastrointestinal:  Negative for abdominal pain, blood in stool, nausea and vomiting.  Genitourinary:  Negative for dysuria and frequency.  Musculoskeletal:  Negative for back pain and falls.  Skin:  Negative for rash.  Neurological:  Negative for dizziness, loss of consciousness and headaches.  Endo/Heme/Allergies:  Negative for environmental allergies.  Psychiatric/Behavioral:  Negative for depression. The patient is not nervous/anxious.       Objective:     BP (!) 140/90 (BP Location: Left Arm, Patient Position: Sitting, Cuff Size: Normal)   Pulse 66   Temp 98.6 F (37 C) (Oral)   Resp 16   Ht 5\' 6"  (1.676 m)   Wt 162 lb 12.8 oz (73.8 kg)   SpO2 97%   BMI 26.28 kg/m  BP Readings from Last 3 Encounters:  05/19/23 (!) 140/90  08/27/22 118/82  04/05/22 118/76   Wt Readings from Last 3 Encounters:  05/19/23 162 lb 12.8 oz (73.8 kg)  02/11/23 158 lb (71.7 kg)  08/27/22 163 lb 6.4 oz (74.1 kg)   SpO2 Readings from Last 3 Encounters:  05/19/23 97%  08/27/22 98%  04/05/22 97%      Physical Exam Vitals and nursing note reviewed.  Constitutional:      General: She is not in acute distress.    Appearance: Normal appearance. She is well-developed.  HENT:     Head: Normocephalic and atraumatic.  Eyes:     General: No scleral icterus.       Right eye: No discharge.        Left eye: No discharge.   Cardiovascular:     Rate and Rhythm: Normal rate and regular rhythm.     Heart sounds: No murmur heard. Pulmonary:     Effort: Pulmonary effort is normal. No respiratory distress.     Breath sounds: Normal breath sounds.  Musculoskeletal:        General: Normal range of motion.     Cervical back: Normal range of motion and neck supple.     Right lower leg: No edema.     Left lower leg: No edema.  Skin:    General: Skin is warm and dry.  Neurological:     Mental Status: She is alert and oriented to person, place, and time.  Psychiatric:        Mood and Affect: Mood normal.        Behavior: Behavior normal.        Thought Content: Thought content normal.        Judgment: Judgment normal.      No results found for any visits on 05/19/23.  Last CBC Lab Results  Component Value Date   WBC 5.9 04/05/2022   HGB 15.2 04/05/2022   HCT 44.4 04/05/2022   MCV 90.8 04/05/2022   MCH 31.1 04/05/2022   RDW 12.9 04/05/2022   PLT 229 04/05/2022   Last metabolic panel Lab Results  Component Value Date   GLUCOSE 80 04/05/2022   NA 140 04/05/2022   K 4.7 04/05/2022   CL 104 04/05/2022   CO2 23 04/05/2022   BUN 15 04/05/2022   CREATININE 0.78 04/05/2022   GFR 72.00 04/13/2021   CALCIUM 10.3 04/05/2022   PROT 7.7 04/05/2022   ALBUMIN 4.6 04/13/2021   BILITOT 0.6 04/05/2022   ALKPHOS 50 04/13/2021   AST 22 04/05/2022  ALT 19 04/05/2022   Last lipids Lab Results  Component Value Date   CHOL 188 04/05/2022   HDL 57 04/05/2022   LDLCALC 110 (H) 04/05/2022   LDLDIRECT 160.0 01/24/2016   TRIG 107 04/05/2022   CHOLHDL 3.3 04/05/2022   Last hemoglobin A1c No results found for: "HGBA1C" Last thyroid functions Lab Results  Component Value Date   TSH 0.58 04/05/2022   T4TOTAL 7.4 11/07/2020   Last vitamin D Lab Results  Component Value Date   VD25OH 32 04/05/2022   Last vitamin B12 and Folate Lab Results  Component Value Date   VITAMINB12 1,831 (H) 10/28/2019       The 10-year ASCVD risk score (Arnett DK, et al., 2019) is: 10.5%    Assessment & Plan:   Problem List Items Addressed This Visit       Unprioritized   Hyperlipidemia   Encourage heart healthy diet such as MIND or DASH diet, increase exercise, avoid trans fats, simple carbohydrates and processed foods, consider a krill or fish or flaxseed oil cap daily.        Relevant Orders   CBC with Differential/Platelet   Comprehensive metabolic panel with GFR   Lipid panel   Essential hypertension   Well controlled, no changes to meds. Encouraged heart healthy diet such as the DASH diet and exercise as tolerated.        Other Visit Diagnoses       Primary hypertension    -  Primary   Relevant Orders   Comprehensive metabolic panel with GFR   Lipid panel   TSH     Vitamin D deficiency         Goiter       Relevant Orders   Thyroid Panel With TSH   Thyroid peroxidase antibody     Pancreatic cyst       Relevant Orders   MR Abdomen W Wo Contrast     Assessment and Plan Assessment & Plan Thyroid Nodule   She experiences dysphagia and hoarseness, possibly indicating esophageal stricture. Previous ultrasound showed nodule reduction, so no biopsy is needed. There is concern about scarring from potential partial thyroidectomy.  Pancreatic Lesion   A simple pancreatic cyst was identified two years ago, with a family history of pancreatic cancer. Simple cysts are typically benign, but follow-up imaging is recommended. Schedule an MRI for follow-up on the pancreatic lesion.  Hypertension   Blood pressure readings are elevated, possibly due to recent influenza and poor sleep. The elevated reading may be transient. Recheck blood pressure at the end of the visit.  Hair Loss   Hair thinning may be related to her thyroid condition and age. She is interested in oral minoxidil, which requires continuous use for efficacy. She is advised to consult a dermatologist for a prescription,  potentially more cost-effective than mail-order.  General Health Maintenance   She takes a CoQ10 supplement due to statin use and is reassured of its safety with statins.    Return in about 6 months (around 11/18/2023) for annual exam, fasting.    Martasia Talamante R Lowne Chase, DO

## 2023-05-19 NOTE — Assessment & Plan Note (Signed)
 Encourage heart healthy diet such as MIND or DASH diet, increase exercise, avoid trans fats, simple carbohydrates and processed foods, consider a krill or fish or flaxseed oil cap daily.

## 2023-05-20 LAB — THYROID PANEL WITH TSH
Free Thyroxine Index: 2.5 (ref 1.4–3.8)
T3 Uptake: 36 % — ABNORMAL HIGH (ref 22–35)
T4, Total: 6.9 ug/dL (ref 5.1–11.9)
TSH: 0.85 m[IU]/L (ref 0.40–4.50)

## 2023-05-20 LAB — THYROID PEROXIDASE ANTIBODY: Thyroperoxidase Ab SerPl-aCnc: 1 [IU]/mL (ref <9)

## 2023-05-23 ENCOUNTER — Encounter: Payer: Self-pay | Admitting: Family Medicine

## 2023-06-24 ENCOUNTER — Ambulatory Visit
Admission: RE | Admit: 2023-06-24 | Discharge: 2023-06-24 | Disposition: A | Discharge status: Home / Self Care | Source: Ambulatory Visit | Attending: Family Medicine | Admitting: Family Medicine

## 2023-06-24 DIAGNOSIS — K760 Fatty (change of) liver, not elsewhere classified: Secondary | ICD-10-CM | POA: Diagnosis not present

## 2023-06-24 DIAGNOSIS — K862 Cyst of pancreas: Secondary | ICD-10-CM

## 2023-06-24 MED ORDER — GADOPICLENOL 0.5 MMOL/ML IV SOLN
7.5000 mL | Freq: Once | INTRAVENOUS | Status: AC | PRN
  Administered 2023-06-24: 7.5 mL via INTRAVENOUS

## 2023-06-26 ENCOUNTER — Ambulatory Visit: Payer: Self-pay | Admitting: Family Medicine

## 2023-07-18 ENCOUNTER — Ambulatory Visit (INDEPENDENT_AMBULATORY_CARE_PROVIDER_SITE_OTHER): Payer: Self-pay | Admitting: Surgical

## 2023-07-18 DIAGNOSIS — Z411 Encounter for cosmetic surgery: Secondary | ICD-10-CM

## 2023-07-18 NOTE — Progress Notes (Signed)
 Botulinum Toxin Procedure Note  Procedure: Cosmetic botulinum toxin  Pre-operative Diagnosis: Dynamic rhytides  Post-operative Diagnosis: Same  Complications:  None  Brief history: The patient desires botulinum toxin injection.  She is aware of the risks including bleeding, damage to deeper structures, asymmetry, brow ptosis, eyelid ptosis, bruising. The patient understands and wishes to proceed.  Procedure: The area was prepped with alcohol and dried with a clean gauze.  Using a clean technique the botulinum toxin was diluted with 2.5 mL of bacteriostatic saline per 100 unit vial which resulted in 4 units per 0.1 mL.  Subsequently the mixture was injected in the glabellar. A total of 20 Units of botulinum toxin was used. Glabellar area was injected with care to inject intramuscular only while holding pressure on the supratrochlear vessels in each area during each injection on either side of the medial corrugators.   No complications were noted. Light pressure was held for 5 minutes. She was instructed explicitly in post-operative care.  Botox LOT:  [id number] EXP:  06/2025

## 2023-08-05 IMAGING — MR MR ABDOMEN WO/W CM
11 of 19 series · 24 of 48 positions shown · IV contrast (multihance)
Comparison: Abdominal ultrasound 04/14/2021

CLINICAL DATA: Indeterminate pancreatic lesion on ultrasound.

EXAM:
MRI ABDOMEN WITHOUT AND WITH CONTRAST
TECHNIQUE: Multiplanar multisequence MR imaging of the abdomen was performed
both before and after the administration of intravenous contrast.
CONTRAST:  14mL MULTIHANCE GADOBENATE DIMEGLUMINE 529 MG/ML IV SOLN

[Series 3: cor haste · coronal · 5.0mm · 0.70mm/px · 2 of 35 slices shown]
[im 1/35]
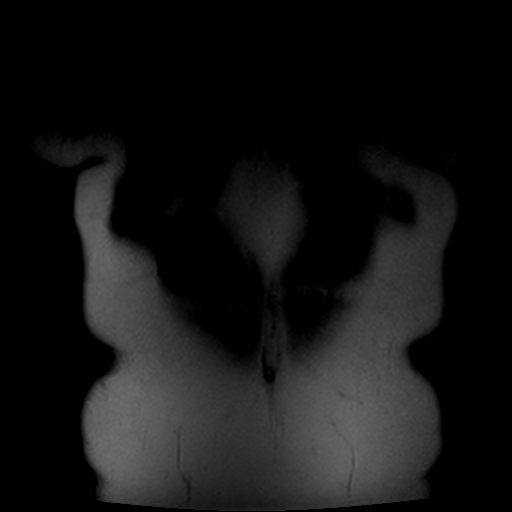
[im 35/35]
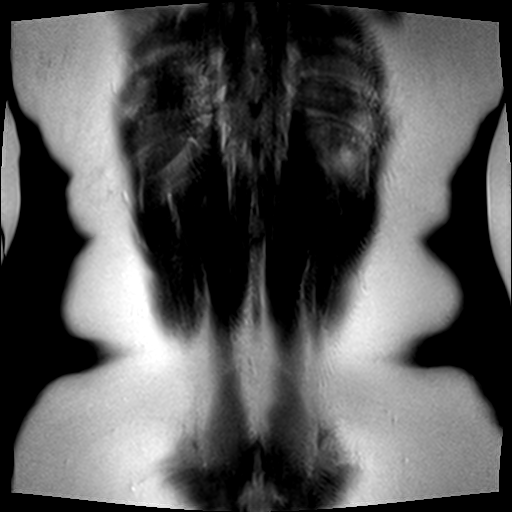

[Series 4: haste axial · axial · 5.0mm · 0.70mm/px · z∈[+18,+224]mm · 2 of 34 slices shown]
[im 1/34]
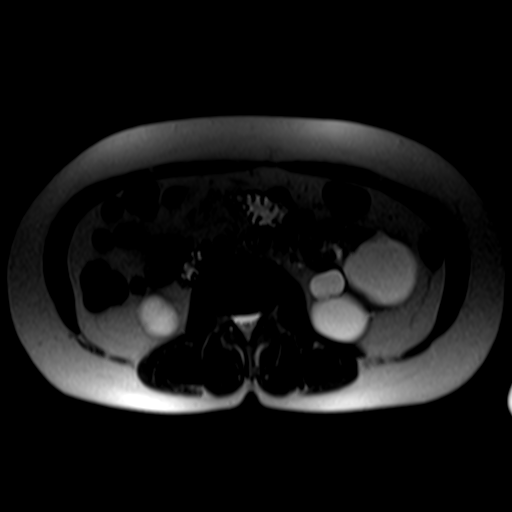
[im 34/34]
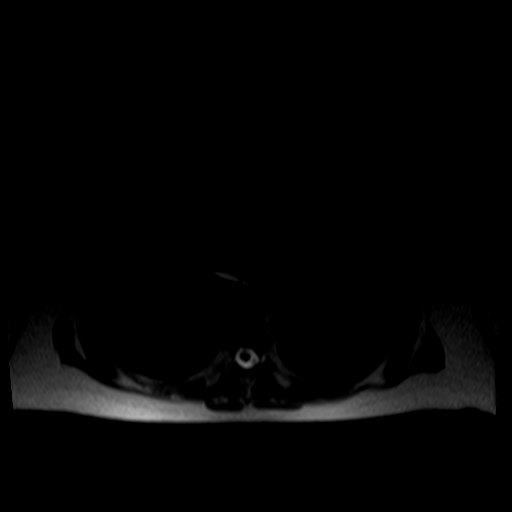

[Series 5: T2 · coronal · 3.0mm · 0.70mm/px · 2 of 50 slices shown (1 of 2)]
[im 1/50]
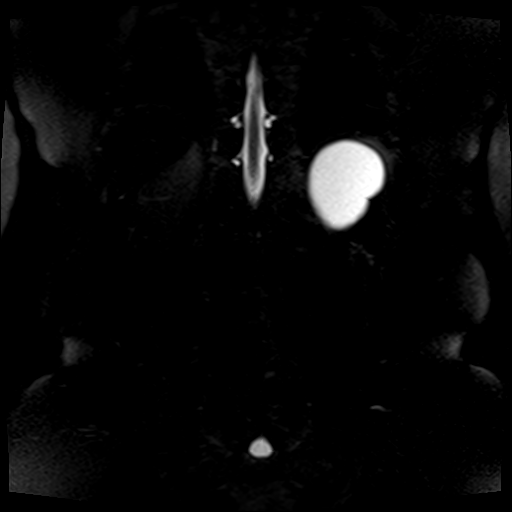
[im 50/50]
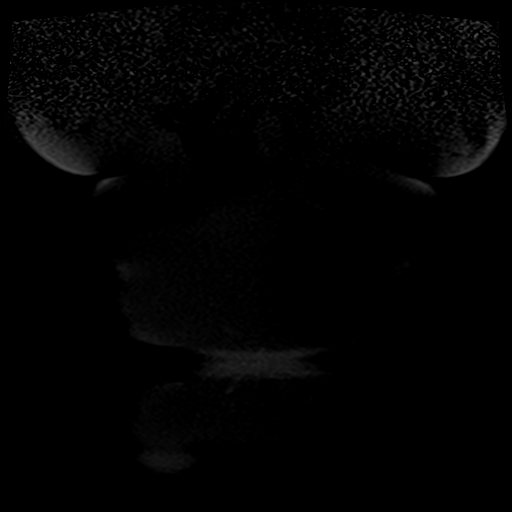

[Series 6: T1 · axial · 5.0mm · 0.70mm/px · z∈[+14,+224]mm · 3 of 72 slices shown]
[im 1/72]
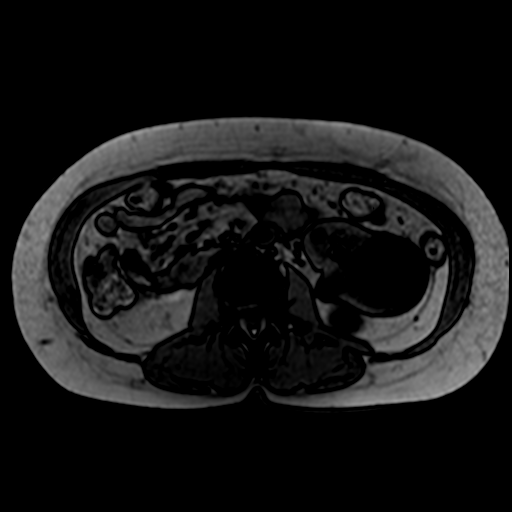
[im 36/72]
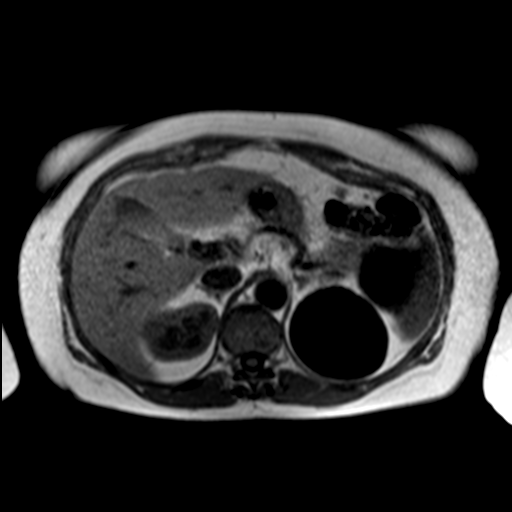
[im 72/72]
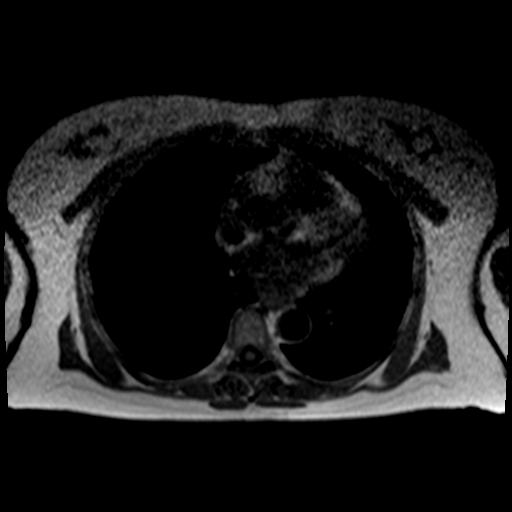

[Series 7: T2 · axial · 5.0mm · 0.94mm/px · 1 of 33 slices shown (2 of 2)]
[im 1/33]
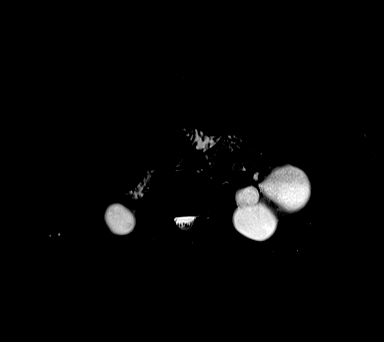

[Series 10: MRCP · sagittal · 0.89mm/px · 1 of 19 slices shown]
[im 1/19]
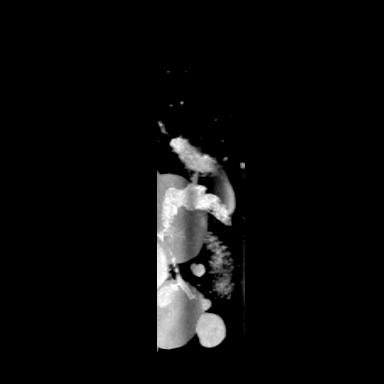

[Series 12: ep2d_[id number]_[id number]_trig · axial · 5.0mm · 1.88mm/px · z∈[+22,+222]mm · 4 of 99 slices shown]
[im 1/99]
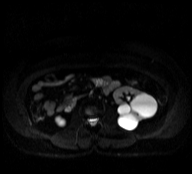
[im 33/99]
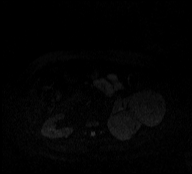
[im 66/99]
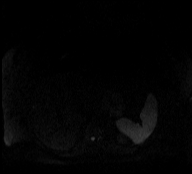
[im 99/99]
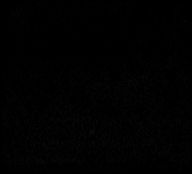

[Series 13: [id number]_trig_adc · axial · 5.0mm · 1.88mm/px · 1 of 33 slices shown]
[im 1/33]
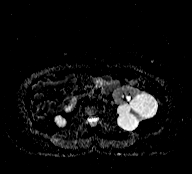

[Series 14: T1 dynamic · axial · non-contrast · 2.5mm · 0.70mm/px · z∈[-18,+199]mm · 3 of 88 slices shown]
[im 1/88]
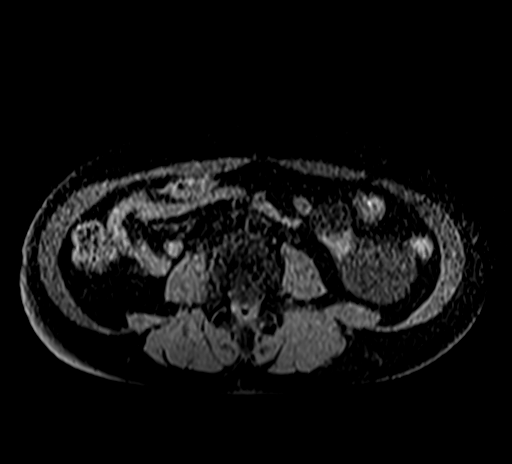
[im 44/88]
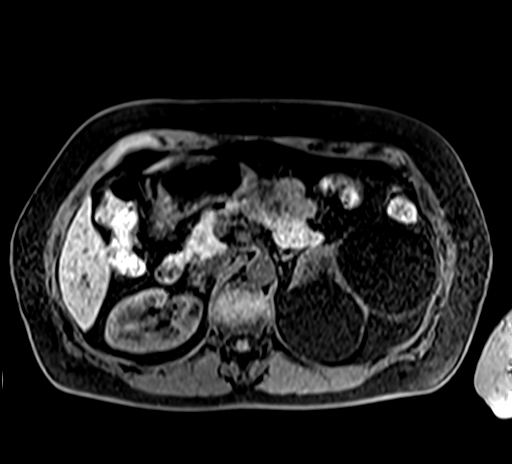
[im 88/88]
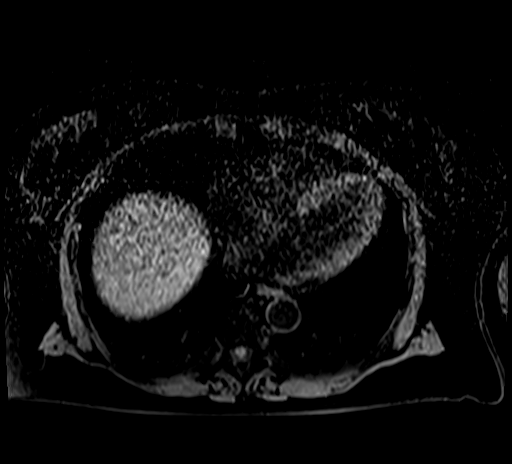

[Series 15: T1 dynamic post-contrast · axial · 2.5mm · 0.70mm/px · z∈[-18,+199]mm · 3 of 88 slices shown (1 of 2)]
[im 1/88]
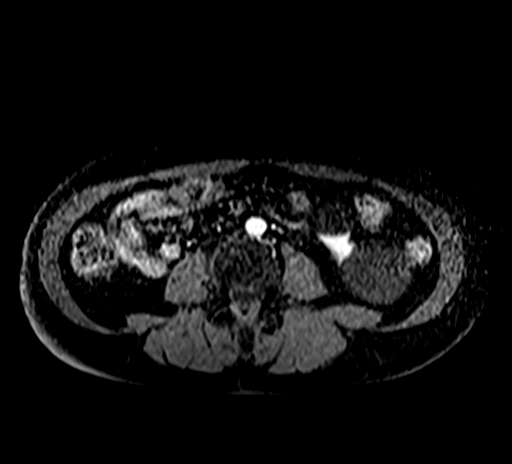
[im 44/88]
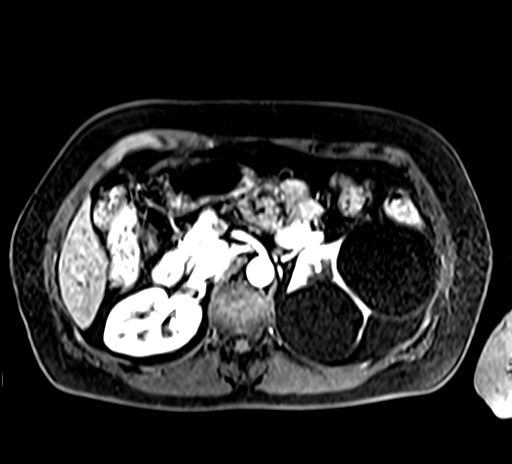
[im 88/88]
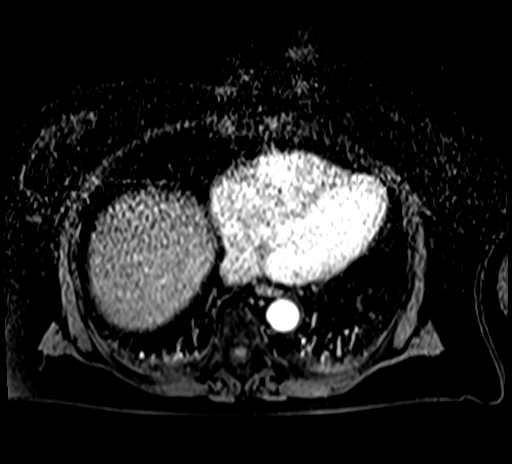

[Series 16: T1 dynamic post-contrast · axial · 2.5mm · 0.70mm/px · z∈[-18,+89]mm · 2 of 88 slices shown (2 of 2)]
[im 1/88]
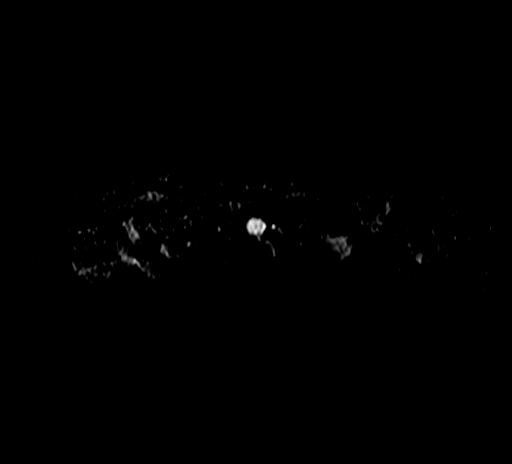
[im 44/88]
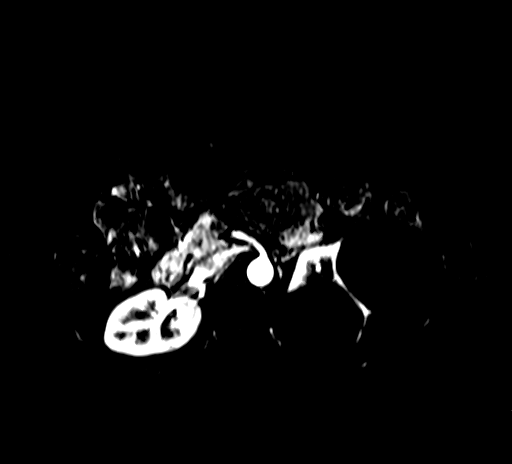

[24 of 48 positions shown; findings below may reference images not displayed]

FINDINGS: Lower chest:  The visualized lower chest appears unremarkable.

Hepatobiliary: The liver is normal in morphology without loss of
signal on the gradient echo opposed phase images to suggest
steatosis. There are scattered small simple cysts which measure up
to 1.0 cm in the right lobe and 1.1 cm in the left lobe. No
enhancing hepatic lesions. No evidence of gallstones, gallbladder
wall thickening or biliary dilatation.

Pancreas: Small T2 hyperintense lesion within the pancreatic body
measures up to 6 mm in diameter and shows no definite communication
with the main pancreatic duct or enhancement following contrast. No
pancreatic ductal dilatation or surrounding inflammation. No
suspicious pancreatic findings.

Spleen: Normal in size without focal abnormality.

Adrenals/Urinary Tract: Both adrenal glands appear normal. As seen
on ultrasound, there are multiple renal cysts bilaterally which
appears simple, largest in the upper interpolar region of the left
kidney measuring up to 8.3 cm on coronal image [DATE]. No evidence of
enhancing or otherwise suspicious renal mass. No hydronephrosis.

Stomach/Bowel: The stomach appears unremarkable for its degree of
distension. No evidence of bowel wall thickening, distention or
surrounding inflammatory change.

Vascular/Lymphatic: There are no enlarged abdominal lymph nodes. No
significant vascular findings.

Other: No evidence of abdominal wall hernia or ascites.

Musculoskeletal: No acute or significant osseous findings.
IMPRESSION: 1. Small simple appearing cystic lesion within the pancreatic body,
unlikely to be clinically significant. Per consensus guidelines,
consider follow-up imaging (with MRI or CT) in 2 years to assess
stability. This recommendation follows ACR consensus guidelines:
Management of Incidental Pancreatic Cysts: A White Paper of the ACR
Incidental Findings Committee. [HOSPITAL] 6791;[DATE].
2. Multiple simple hepatic and renal cysts which do not require any
specific follow-up.
3. No acute abdominal findings.

## 2023-08-10 ENCOUNTER — Other Ambulatory Visit: Payer: Self-pay | Admitting: Family Medicine

## 2023-08-10 DIAGNOSIS — I10 Essential (primary) hypertension: Secondary | ICD-10-CM

## 2023-11-05 ENCOUNTER — Other Ambulatory Visit: Payer: Self-pay | Admitting: Family Medicine

## 2023-11-05 DIAGNOSIS — Z1231 Encounter for screening mammogram for malignant neoplasm of breast: Secondary | ICD-10-CM

## 2023-11-09 ENCOUNTER — Other Ambulatory Visit: Payer: Self-pay | Admitting: Family Medicine

## 2023-11-09 DIAGNOSIS — I10 Essential (primary) hypertension: Secondary | ICD-10-CM

## 2023-11-25 ENCOUNTER — Ambulatory Visit
Admission: RE | Admit: 2023-11-25 | Discharge: 2023-11-25 | Disposition: A | Source: Ambulatory Visit | Attending: Family Medicine | Admitting: Family Medicine

## 2023-11-25 DIAGNOSIS — Z1231 Encounter for screening mammogram for malignant neoplasm of breast: Secondary | ICD-10-CM

## 2023-12-04 ENCOUNTER — Other Ambulatory Visit: Payer: Self-pay | Admitting: Family Medicine

## 2023-12-04 DIAGNOSIS — I10 Essential (primary) hypertension: Secondary | ICD-10-CM

## 2023-12-14 ENCOUNTER — Other Ambulatory Visit: Payer: Self-pay | Admitting: Family Medicine

## 2023-12-14 DIAGNOSIS — I10 Essential (primary) hypertension: Secondary | ICD-10-CM

## 2023-12-15 ENCOUNTER — Ambulatory Visit: Admitting: Internal Medicine

## 2023-12-15 ENCOUNTER — Encounter: Payer: Self-pay | Admitting: Internal Medicine

## 2023-12-15 VITALS — BP 106/64 | HR 95 | Resp 18 | Ht 64.57 in | Wt 163.2 lb

## 2023-12-15 DIAGNOSIS — E042 Nontoxic multinodular goiter: Secondary | ICD-10-CM | POA: Diagnosis not present

## 2023-12-15 NOTE — Progress Notes (Signed)
 Patient ID: Sarah Zuniga, female   DOB: 1955/05/02, 68 y.o.   MRN: [id number]   HPI  Sarah Zuniga is a very pleasant 68 y.o.-year-old female, initially referred by her PCP, Dr. Antonio Meth, returning for  thyroid  nodules. Last visit 1 year and 4 months ago.  Interim history: She continues to have problems swallowing some foods.  She was recently found to have a small hiatal hernia. She is otherwise feeling well, without complaints.   Reviewed history: She was diagnosed with thyroid  nodule in 2016, and at that time she saw ENT (Dr. Mable).  He ordered a thyroid  ultrasound and then biopsied the largest nodule.  Results were benign.  Thyroid  U/S (01/31/2015): Entire gland heterogeneous, with increased blood flow; right dominant nodule, 3.5 cm, isoechoic, mixed, with some blood flow, with spongiform appearance per my review of the images.  She has another right mid thyroid  nodule, 1.4 cm.  FNA of the right dominant nodule (04/06/2015): Benign  Thyroid  U/S (06/05/2017): CLINICAL DATA:  Goiter. 68 year old female with a history of multiple thyroid  nodules. Patient previously underwent biopsy of the lobulated Largest right thyroid  nodule on 04/06/2015  Parenchymal Echotexture: Markedly heterogenous Isthmus: 0.6 cm Right lobe: 8.0 x 3.0 x 3.5 cm Left lobe: 5.0 x 1.5 x 1.7 cm _______________________________________________________  The previously biopsied mass occupying the majority of the right inferior gland measures 4.2 x 3.5 x 2.4 cm, slightly enlarged compared to 3.5 x 2.9 x 2.3 cm previously.   There is a 1.6 cm anechoic simple cyst in the right mid gland.   Multiple additional hypoechoic cysts and nodules bilaterally, none of which meet criteria for further evaluation.  IMPRESSION: 1. Slight interval enlargement of the previously biopsied thyroid  nodule occupying the majority of the right lower lobe compared to 01/31/2015. Recommend correlation with prior biopsy results. 2.  Multiple bilateral thyroid  cysts and nodules which do not meet criteria for biopsy or further imaging evaluation.  The right thyroid  nodule was large and increased from before >> I suggested a biopsy.  FNA of this nodule (07/15/2017): AUS/FLUS, however, Afirma molecular test: benign (ROM 4%)  Thyroid  U/S (10/02/2020): Parenchymal Echotexture: Moderately heterogeneous Isthmus: 0.6 cm Right lobe: 8.7 x 3.1 x 4.0 cm Left lobe: 5.4 x 1.4 x 1.1 cm _________________________________________________________   Nodule 1: 1.0 x 0.5 x 1.0 cm spongiform nodule in the superior right thyroid  lobe does not meet criteria for imaging surveillance or FNA. ________________________________________________________   Nodule 2: 1.3 x 0.9 x 1.1 cm mixed solid cystic nodule in the superior right thyroid  lobe does not meet criteria for imaging surveillance or FNA.  _________________________________________________________   Nodule 3: 1.6 x 1.4 x 1.2 cm spongiform nodule in the mid right thyroid  lobe does not meet criteria for imaging surveillance or FNA.  _________________________________________________________   Additional subcentimeter left thyroid  nodules do not meet criteria for FNA or imaging surveillance.   IMPRESSION: 1. Previously biopsied right thyroid  nodule is not well defined on the current examination. 2. Remaining thyroid  nodules do not meet criteria for FNA or imaging surveillance.  Thyroid  U/S (09/05/2022): Parenchymal Echotexture: Moderately heterogeneous  Isthmus: 0.7 cm ,previously 0.6 cm  Right lobe: 6.5 x 3.1 x 3.3 cm ,previously 8.7 x 3.1 x 4.0 cm  Left lobe: 6.0 x 1.4 x 1.5 cm ,previously 5.4 x 1.4 x 1.1 cm  ________________________________________________________   Estimated total number of nodules >/= 1 cm: 4  _________________________________________________________   Interval cystic degeneration of previously visualized left superior spongiform nodule which  again appears  benign (labeled 2, 1.6 cm, previously labeled 1, 1.0 cm).   Unchanged benign appearance of right superior solid cystic nodule (labeled 1, 1.2 cm, previously 1.3 cm).   Similar benign appearing right mid spongiform nodule (labeled 3, 1.8 cm, previous 1.6 cm).   Similar appearance of previously biopsied isoechoic solid nodule in the right inferior thyroid  (labeled 4, 4.0 cm, previously 4.5 cm).   There are multifocal similar appearing additional left hemi thyroid  subcentimeter, benign-appearing nodules which do not warrant follow-up.   No cervical lymphadenopathy.   IMPRESSION: 1. Similar appearing multinodular thyroid  gland. 2. Similar appearance of previously biopsied right inferior solid thyroid  nodule (labeled 4, 4.0 cm, previously 4.5 cm). Recommend correlation with prior biopsy results. 3. The additional multifocal bilateral thyroid  nodules appear benign and do not warrant additional follow-up.  She denies: - dysphagia - choking - SOB with lying down Occasional hoarseness when talking more, also occasional difficulty swallowing - chews food well.  I reviewed her TFTs: Lab Results  Component Value Date   TSH 0.85 05/19/2023   TSH 0.85 05/19/2023   TSH 0.58 04/05/2022   TSH 0.76 11/07/2020   TSH 0.71 11/01/2019   TSH 0.73 03/31/2018   TSH 0.75 02/27/2017   TSH 0.90 01/24/2016   TSH 0.60 01/19/2015   TSH 0.877 01/07/2014   FREET4 0.88 03/31/2018   FREET4 0.86 12/05/2011   Her TPO and ATA antibodies were not elevated: Component     Latest Ref Rng 05/27/2017 05/19/2023  Thyroglobulin Ab     < or = 1 IU/mL <1    Thyroperoxidase Ab SerPl-aCnc     <9 IU/mL 2  1    She was taking selenium 100 mcg daily and also 1 teaspoon of Dulse - red algae (iodine) daily.  She stopped the supplement.  + FH of thyroid  ds. - M, sisters. No FH of thyroid  cancer. No h/o radiation tx to head or neck. No herbal supplements. On collagen supplement. No Biotin. No recent steroids use.    Pt also has a history of an autoimmune ds.  - increased reabsorption of the teeth.  ROS: + See HPI  I reviewed pt's medications, allergies, PMH, social hx, family hx, and changes were documented in the history of present illness. Otherwise, unchanged from my initial visit note.  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 , left   Hyperlipidemia    Hypertension    Pigment dispersion syndrome of both eyes    Thyroid  disease    Past Surgical History:  Procedure Laterality Date   CATARACT EXTRACTION Right    EYE SURGERY Left 12/08/2014   cataract surgery   TONSILLECTOMY     WISDOM TOOTH EXTRACTION     Social History   Socioeconomic History   Marital status: Divorced    Spouse name: Not on file   Number of children: 0   Years of education: college 3   Highest education level: Not on file  Occupational History   Occupation: production designer, theatre/television/film  Social Needs  Tobacco Use   Smoking status: Never Smoker   Smokeless tobacco: Never Used  Substance and Sexual Activity   Alcohol use: Yes    Comment: Wine, vodka-weekend   Drug use: No  Exercise: Cardio, weights, 4-5 times a week.  Current Outpatient Medications on File Prior to Visit  Medication Sig Dispense Refill   cetirizine (ZYRTEC) 10 MG tablet Take 10 mg  by mouth daily.       Cholecalciferol (VITAMIN D3) 2000 units TABS Take 1 tablet by mouth daily.     Cyanocobalamin (B-12) 5000 MCG SUBL Place 5,000 mcg under the tongue daily.     lisinopril  (ZESTRIL ) 20 MG tablet Take 1 tablet (20 mg total) by mouth daily. Needs appt 15 tablet 0   Magnesium 400 MG CAPS Take 1 capsule by mouth daily.     Multiple Vitamin (MULTIVITAMIN) tablet Take 1 tablet by mouth daily.     simvastatin  (ZOCOR ) 40 MG tablet Take 1 tablet (40 mg total) by mouth at bedtime. 90 tablet 2   No current facility-administered medications on file prior to visit.   No Known Allergies Family History  Problem  Relation Age of Onset   Diabetes Mother    Hypertension Mother    Hyperlipidemia Mother    Stroke Mother    Kidney disease Mother    Colon polyps Father    Cancer Father        lung   Hyperlipidemia Father    Hypertension Father    Breast cancer Sister    Hyperlipidemia Sister    Hypertension Sister    Esophageal cancer Maternal Grandmother    Breast cancer Paternal Grandmother    Coronary artery disease Other        1st degree relative female <50.SABRASABRAfemale <60   Colon cancer Neg Hx    Rectal cancer Neg Hx    Stomach cancer Neg Hx    PE: BP 106/64   Pulse 95   Resp 18   Ht 5' 4.57 (1.64 m)   Wt 163 lb 3.2 oz (74 kg)   SpO2 96%   BMI 27.52 kg/m  Wt Readings from Last 15 Encounters:  12/15/23 163 lb 3.2 oz (74 kg)  05/19/23 162 lb 12.8 oz (73.8 kg)  02/11/23 158 lb (71.7 kg)  08/27/22 163 lb 6.4 oz (74.1 kg)  04/05/22 160 lb (72.6 kg)  03/08/22 159 lb (72.1 kg)  01/30/22 157 lb (71.2 kg)  04/13/21 157 lb 3.2 oz (71.3 kg)  03/21/21 158 lb (71.7 kg)  03/07/21 158 lb (71.7 kg)  11/07/20 158 lb 12.8 oz (72 kg)  09/27/20 160 lb 9.6 oz (72.8 kg)  11/01/19 149 lb 3.2 oz (67.7 kg)  01/18/19 177 lb (80.3 kg)  03/31/18 173 lb 3.2 oz (78.6 kg)   Constitutional: overweight, in NAD Eyes:  EOMI, no exophthalmos ENT: no neck masses, + R>L thyromegaly, no cervical lymphadenopathy Cardiovascular: RRR, No MRG Respiratory: CTA B Musculoskeletal: no deformities Skin:no rashes Neurological: no tremor with outstretched hands  ASSESSMENT: 1. Multiple thyroid  nodules  PLAN: 1. Multiple thyroid  nodules -Patient with a history of thyroid  nodules, which the dominant nodule was biopsied in 2016 with benign results.  In 2019, she had another ultrasound which showed a slightly larger nodule and we proceeded with an nodule biopsy which was inconclusive, but with an Afirma molecular marker returning benign.  The nodule was not hypoechoic did not have microcalcifications, was more wider than  tall, and with smooth margins.  It did have some internal blood flow but not much different compared to the rest of the thyroid , in which the blood flow was also increased.  Another ultrasound checked in 09/2020 showed that the previous nodule was not well-defined anymore.  The rest of the nodules were not worrisome and did not meet criteria for follow-up. - She had another ultrasound 09/05/2022 showing interval cystic degeneration of the left superior  spongiform nodule, measuring 1.6 cm, and stable nodule otherwise, with the previously biopsied right nodule, actually smaller in size. - Patient's thyroid  antibodies were not elevated to point towards Hashimoto's thyroiditis.  Also, her thyroid  tests including at last check on 05/19/2023 were normal (TSH 0.85, TPO antibodies 1) - At last visit, she still had some problems swallowing and voice fatigue but these were not new.  She continues to notice the nodule whenever she looks in the mirror.  She still has right thyroid  prominence on palpation.  Based on the latest ultrasound, there is no recommendation for surgery.  We did previously discussed that even thyroid  lobectomy may raise her risk of hypothyroidism.  She is aware about levothyroxine as her mother who had hypothyroidism was taking it.  However, she did want to avoid thyroid  surgery if possible.  However, at today's visit she still feels that her thyroid  nodule is larger.  We reviewed together the images of her latest ultrasound and it appears that the nodule has a heterogeneous composition including many cystic spaces, which, we discussed, can accumulate fluid.  We discussed at today's visit about other possible options, particularly radiofrequency ablation.  She would be interested in this.  I am not sure if this she is a candidate for this, but she does have a large nodule which was biopsied twice with benign results so I am hoping that she could have RFA for this.  I will refer her to interventional  radiology (Dr. Jennefer). - RTC in 1 year  Orders Placed This Encounter  Procedures   Ambulatory referral to Interventional Radiology   Lela Fendt, MD PhD [facility]

## 2023-12-15 NOTE — Telephone Encounter (Signed)
 Pt aware she is overdue for appt, see below.   Me to Sarah Zuniga [facility]     11/18/23 12:39 PM To follow-up on your blood pressure and any needed labs.    KDC  Last read by Sarah Zuniga at 12:44PM on 11/18/2023. Sarah Zuniga to Me     11/18/23 12:22 PM I'm not sure what the follow-up is for? Me to Sarah Zuniga [facility]     11/10/23 10:36 AM Good morning, Per our records, you are due for a follow-up with Dr. Antonio. Please call the office to schedule an appointment at your earliest convenience.    Thank you, KDC  Last read by Sarah Zuniga at 12:44PM on 11/18/2023.     Rx denied.

## 2023-12-15 NOTE — Patient Instructions (Signed)
 I will refer you to Interventional Radiology for RFA.  Please return to see me in 1 year.

## 2023-12-22 ENCOUNTER — Other Ambulatory Visit: Payer: Self-pay | Admitting: Internal Medicine

## 2023-12-22 DIAGNOSIS — E042 Nontoxic multinodular goiter: Secondary | ICD-10-CM

## 2024-02-12 ENCOUNTER — Ambulatory Visit (INDEPENDENT_AMBULATORY_CARE_PROVIDER_SITE_OTHER): Payer: Self-pay | Admitting: Student

## 2024-02-12 DIAGNOSIS — Z411 Encounter for cosmetic surgery: Secondary | ICD-10-CM

## 2024-02-12 NOTE — Progress Notes (Signed)
 Botulinum Toxin Procedure Note  Patient is a 69 year old female who presents to the clinic today for Botox injection.  She states that she has been receiving Botox for several years and she is requesting Botox just to her glabellar region.  She states that she was happy with the 20 units that she received the last time she was here.  Per chart review, she was here back in June 2025 for Botox.  Procedure: Cosmetic botulinum toxin  Pre-operative Diagnosis: Dynamic rhytides  Post-operative Diagnosis: Same  Complications:  None  Brief history: The patient desires botulinum toxin injection.  She is aware of the risks including bleeding, damage to deeper structures, asymmetry, brow ptosis, eyelid ptosis, bruising. The patient understands and wishes to proceed.  Procedure: The area was prepped with alcohol and dried with a clean gauze.  Using a clean technique the botulinum toxin was diluted with 2.5 mL of bacteriostatic saline per 100 unit vial which resulted in 4 units per 0.1 mL.  Subsequently the mixture was injected in the glabellar region. A total of 20 Units of botulinum toxin was used. The glabellar area was injected with care to inject intramuscular only while holding pressure on the supratrochlear vessels in each area during each injection on either side of the medial corrugators.   No complications were noted. Light pressure was held. She was instructed explicitly in post-operative care.  Botox LOT:  [id number] EXP:  12/27

## 2024-02-18 ENCOUNTER — Ambulatory Visit (INDEPENDENT_AMBULATORY_CARE_PROVIDER_SITE_OTHER)

## 2024-02-18 VITALS — BP 136/84 | HR 62 | Temp 97.9°F | Resp 17 | Ht 65.0 in | Wt 161.2 lb

## 2024-02-18 DIAGNOSIS — Z0000 Encounter for general adult medical examination without abnormal findings: Secondary | ICD-10-CM

## 2024-02-18 NOTE — Patient Instructions (Signed)
 Sarah Zuniga,  Thank you for taking the time for your Medicare Wellness Visit. I appreciate your continued commitment to your health goals. Please review the care plan we discussed, and feel free to reach out if I can assist you further.  Please note that Annual Wellness Visits do not include a physical exam. Some assessments may be limited, especially if the visit was conducted virtually. If needed, we may recommend an in-person follow-up with your provider.  Ongoing Care Seeing your primary care provider every 3 to 6 months helps us  monitor your health and provide consistent, personalized care.   Referrals If a referral was made during today's visit and you haven't received any updates within two weeks, please contact the referred provider directly to check on the status.  Recommended Screenings:  Health Maintenance  Topic Date Due   COVID-19 Vaccine (7 - 2025-26 season) 10/06/2023   Medicare Annual Wellness Visit  02/11/2024   Colon Cancer Screening  03/21/2024   Breast Cancer Screening  11/24/2024   DTaP/Tdap/Td vaccine (3 - Td or Tdap) 01/18/2025   Pneumococcal Vaccine for age over 81  Completed   Flu Shot  Completed   Osteoporosis screening with Bone Density Scan  Completed   Hepatitis C Screening  Completed   Zoster (Shingles) Vaccine  Completed   Meningitis B Vaccine  Aged Out       02/13/2024   10:20 AM  Advanced Directives  Does Patient Have a Medical Advance Directive? Yes  Type of Estate Agent of [place];Living will;Out of facility DNR (pink MOST or yellow form)  Does patient want to make changes to medical advance directive? No - Patient declined  Copy of Healthcare Power of Attorney in Chart? No - copy requested    Vision: Annual vision screenings are recommended for early detection of glaucoma, cataracts, and diabetic retinopathy. These exams can also reveal signs of chronic conditions such as diabetes and high blood pressure.  Dental:  Annual dental screenings help detect early signs of oral cancer, gum disease, and other conditions linked to overall health, including heart disease and diabetes.  Please see the attached documents for additional preventive care recommendations.

## 2024-02-18 NOTE — Progress Notes (Signed)
 " I connected with  Sarah Zuniga on 02/18/2024   Patient Location: Other:  in office/clinic  Provider Location: Office/Clinic  Persons Participating in Visit: Patient.  I discussed the limitations of evaluation and management by telemedicine. The patient expressed understanding and agreed to proceed.  Vital Signs: Because this visit was a virtual/telehealth visit, some criteria may be missing or patient reported. Any vitals not documented were not able to be obtained and vitals that have been documented are patient reported.  Chief Complaint  Patient presents with   Medicare Wellness     Subjective:   Sarah Zuniga is a 69 y.o. female who presents for a Medicare Annual Wellness Visit.  Visit info / Clinical Intake: Medicare Wellness Visit Type:: Subsequent Annual Wellness Visit Persons participating in visit and providing information:: patient Medicare Wellness Visit Mode:: In-person (required for WTM) Interpreter Needed?: No Pre-visit prep was completed: yes AWV questionnaire completed by patient prior to visit?: yes Date:: 02/13/24 Living arrangements:: (!) (Patient-Rptd) lives alone Patient's Overall Health Status Rating: (Patient-Rptd) very good Typical amount of pain: (Patient-Rptd) some Does pain affect daily life?: (Patient-Rptd) no Are you currently prescribed opioids?: no  Dietary Habits and Nutritional Risks How many meals a day?: (Patient-Rptd) 2 Eats fruit and vegetables daily?: (Patient-Rptd) yes Most meals are obtained by: (Patient-Rptd) preparing own meals In the last 2 weeks, have you had any of the following?: none Diabetic:: no  Functional Status Activities of Daily Living (to include ambulation/medication): (Patient-Rptd) Independent Ambulation: (Patient-Rptd) Independent Medication Administration: (Patient-Rptd) Independent Home Management (perform basic housework or laundry): (Patient-Rptd) Independent Manage your own finances?: (Patient-Rptd)  yes Primary transportation is: (Patient-Rptd) driving Concerns about vision?: no *vision screening is required for WTM* Concerns about hearing?: no  Fall Screening Falls in the past year?: (Patient-Rptd) 0 Number of falls in past year: 0 Was there an injury with Fall?: 0 Fall Risk Category Calculator: 0 Patient Fall Risk Level: Low Fall Risk  Fall Risk Patient at Risk for Falls Due to: No Fall Risks Fall risk Follow up: Falls evaluation completed; Falls prevention discussed  Home and Transportation Safety: All rugs have non-skid backing?: (Patient-Rptd) yes All stairs or steps have railings?: (Patient-Rptd) yes Grab bars in the bathtub or shower?: (Patient-Rptd) yes Have non-skid surface in bathtub or shower?: (Patient-Rptd) yes Good home lighting?: (Patient-Rptd) yes Regular seat belt use?: (Patient-Rptd) yes Hospital stays in the last year:: (Patient-Rptd) no  Cognitive Assessment Difficulty concentrating, remembering, or making decisions? : (Patient-Rptd) no Will 6CIT or Mini Cog be Completed: yes What year is it?: 0 points What month is it?: 0 points About what time is it?: 0 points Count backwards from 20 to 1: 0 points Say the months of the year in reverse: 0 points Repeat the address phrase from earlier: 0 points 6 CIT Score: 0 points  Advance Directives (For Healthcare) Does Patient Have a Medical Advance Directive?: Yes Does patient want to make changes to medical advance directive?: No - Patient declined Type of Advance Directive: Healthcare Power of [place]; Living will; Out of facility DNR (pink MOST or yellow form) Copy of Healthcare Power of Attorney in Chart?: No - copy requested Copy of Living Will in Chart?: No - copy requested Out of facility DNR (pink MOST or yellow form) in Chart? (Ambulatory ONLY): No - copy requested  Reviewed/Updated  Reviewed/Updated: Reviewed All (Medical, Surgical, Family, Medications, Allergies, Care Teams, Patient  Goals)    Allergies (verified) Patient has no known allergies.   Current Medications (verified)  Outpatient Encounter Medications as of 02/18/2024  Medication Sig   albuterol (VENTOLIN HFA) 108 (90 Base) MCG/ACT inhaler Inhale 2 puffs into the lungs.   cetirizine (ZYRTEC) 10 MG tablet Take 10 mg by mouth daily.     Cholecalciferol (VITAMIN D3) 2000 units TABS Take 1 tablet by mouth daily.   Cyanocobalamin (B-12) 5000 MCG SUBL Place 5,000 mcg under the tongue daily.   lisinopril  (ZESTRIL ) 20 MG tablet Take 1 tablet (20 mg total) by mouth daily. Needs appt   Magnesium 400 MG CAPS Take 1 capsule by mouth daily.   minoxidil (LONITEN) 2.5 MG tablet Take 2.5 mg by mouth daily.   Multiple Vitamin (MULTIVITAMIN) tablet Take 1 tablet by mouth daily.   simvastatin  (ZOCOR ) 40 MG tablet Take 1 tablet (40 mg total) by mouth at bedtime.   No facility-administered encounter medications on file as of 02/18/2024.    History: 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 , left   Hyperlipidemia    Hypertension    Pigment dispersion syndrome of both eyes    Thyroid  disease    Past Surgical History:  Procedure Laterality Date   CATARACT EXTRACTION Right    EYE SURGERY Left 12/08/2014   cataract surgery   TONSILLECTOMY     WISDOM TOOTH EXTRACTION     Family History  Problem Relation Age of Onset   Diabetes Mother    Hypertension Mother    Hyperlipidemia Mother    Stroke Mother    Kidney disease Mother    Colon polyps Father    Cancer Father        lung   Hyperlipidemia Father    Hypertension Father    Hyperlipidemia Sister    Breast cancer Sister    Hypertension Sister    Hypertension Sister    Hyperlipidemia Sister    Esophageal cancer Maternal Grandmother    Breast cancer Paternal Grandmother    Coronary artery disease Other        1st degree relative female <50.SABRASABRAfemale <60   Colon cancer Neg Hx    Rectal cancer Neg Hx     Stomach cancer Neg Hx    Social History   Occupational History   Occupation: scientific laboratory technician   Occupation: [employer]: [employer]  Tobacco Use   Smoking status: Never   Smokeless tobacco: Never  Substance and Sexual Activity   Alcohol use: Yes    Comment: wine weekly   Drug use: No   Sexual activity: Not Currently    Partners: Male   Tobacco Counseling Counseling given: Not Answered  SDOH Screenings   Food Insecurity: No Food Insecurity (02/13/2024)  Housing: Low Risk (02/13/2024)  Transportation Needs: No Transportation Needs (02/13/2024)  Utilities: Not At Risk (02/18/2024)  Alcohol Screen: Low Risk (02/13/2024)  Depression (PHQ2-9): Low Risk (02/18/2024)  Financial Resource Strain: Low Risk (02/13/2024)  Physical Activity: Sufficiently Active (02/13/2024)  Social Connections: Moderately Integrated (02/13/2024)  Stress: No Stress Concern Present (02/13/2024)  Tobacco Use: Low Risk (02/18/2024)  Health Literacy: Adequate Health Literacy (02/18/2024)   See flowsheets for full screening details  Depression Screen PHQ 2 & 9 Depression Scale- Over the past 2 weeks, how often have you been bothered by any of the following problems? Little interest or pleasure in doing things: 0 Feeling down, depressed, or hopeless (PHQ Adolescent also includes...irritable): 0 PHQ-2 Total Score: 0 Trouble falling  or staying asleep, or sleeping too much: 0 Feeling tired or having little energy: 0 Poor appetite or overeating (PHQ Adolescent also includes...weight loss): 0 Feeling bad about yourself - or that you are a failure or have let yourself or your family down: 0 Trouble concentrating on things, such as reading the newspaper or watching television (PHQ Adolescent also includes...like school work): 0 Moving or speaking so slowly that other people could have noticed. Or the opposite - being so fidgety or restless that you have been moving around a lot more than usual: 0 Thoughts that  you would be better off dead, or of hurting yourself in some way: 0 PHQ-9 Total Score: 0 If you checked off any problems, how difficult have these problems made it for you to do your work, take care of things at home, or get along with other people?: Not difficult at all  Depression Treatment Depression Interventions/Treatment : EYV7-0 Score <4 Follow-up Not Indicated     Goals Addressed             This Visit's Progress    Patient Stated   On track    Stay fit and active              Objective:    Today's Vitals   02/18/24 1043  BP: 136/84  Pulse: 62  Resp: 17  Temp: 97.9 F (36.6 C)  Weight: 161 lb 3.2 oz (73.1 kg)  Height: 5' 5 (1.651 m)   Body mass index is 26.83 kg/m.  Hearing/Vision screen Hearing Screening - Comments:: No difficulties  Vision Screening - Comments:: Patient wears glasses . Patient states she sees Dr. Mcuen at [facility] eye  Immunizations and Health Maintenance Health Maintenance  Topic Date Due   COVID-19 Vaccine (7 - 2025-26 season) 10/06/2023   Medicare Annual Wellness (AWV)  02/11/2024   Colonoscopy  03/21/2024   Mammogram  11/24/2024   DTaP/Tdap/Td (3 - Td or Tdap) 01/18/2025   Pneumococcal Vaccine: 50+ Years  Completed   Influenza Vaccine  Completed   Bone Density Scan  Completed   Hepatitis C Screening  Completed   Zoster Vaccines- Shingrix  Completed   Meningococcal B Vaccine  Aged Out        Assessment/Plan:  This is a routine wellness examination for Sarah Zuniga.  Patient Care Team: Antonio Meth, Jamee SAUNDERS, DO as PCP - General Leslee Reusing, MD as Consulting Physician (Ophthalmology) Jakie Alm SAUNDERS, MD as Consulting Physician (Gastroenterology)  I have personally reviewed and noted the following in the patients chart:   Medical and social history Use of alcohol, tobacco or illicit drugs  Current medications and supplements including opioid prescriptions. Functional ability and status Nutritional  status Physical activity Advanced directives List of other physicians Hospitalizations, surgeries, and ER visits in previous 12 months Vitals Screenings to include cognitive, depression, and falls Referrals and appointments  No orders of the defined types were placed in this encounter.  In addition, I have reviewed and discussed with patient certain preventive protocols, quality metrics, and best practice recommendations. A written personalized care plan for preventive services as well as general preventive health recommendations were provided to patient.   Sarah Zuniga Right, [place]   02/18/2024   No follow-ups on file.  After Visit Summary: (In Person-Declined) Patient declined AVS at this time.  No voiced or noted concerns at this time Vaccines not given: covid vaccinations declined today Screenings not ordered: Declined Referral/Order for colonoscopy she will schedule it for next month   "

## 2025-02-24 ENCOUNTER — Ambulatory Visit
